# Patient Record
Sex: Male | Born: 1945 | Race: White | Hispanic: No | Marital: Married | Smoking: Former smoker
Health system: Southern US, Community
[De-identification: ages and names within clinical notes are randomized; demographics above are authoritative.]

## PROBLEM LIST (undated history)

## (undated) DIAGNOSIS — C61 Malignant neoplasm of prostate: Secondary | ICD-10-CM

## (undated) DIAGNOSIS — I4891 Unspecified atrial fibrillation: Secondary | ICD-10-CM

## (undated) DIAGNOSIS — I10 Essential (primary) hypertension: Secondary | ICD-10-CM

## (undated) DIAGNOSIS — M109 Gout, unspecified: Secondary | ICD-10-CM

## (undated) HISTORY — PX: PROSTATE SURGERY: SHX751

## (undated) HISTORY — PX: HERNIA REPAIR: SHX51

## (undated) HISTORY — PX: ROTATOR CUFF REPAIR: SHX139

## (undated) HISTORY — DX: Malignant neoplasm of prostate: C61

## (undated) HISTORY — DX: Unspecified atrial fibrillation: I48.91

## (undated) HISTORY — DX: Gout, unspecified: M10.9

## (undated) HISTORY — DX: Essential (primary) hypertension: I10

---

## 2021-03-23 ENCOUNTER — Other Ambulatory Visit: Payer: Self-pay

## 2021-03-23 ENCOUNTER — Ambulatory Visit (INDEPENDENT_AMBULATORY_CARE_PROVIDER_SITE_OTHER): Payer: Self-pay | Admitting: Urology

## 2021-03-23 ENCOUNTER — Encounter: Payer: Self-pay | Admitting: Urology

## 2021-03-23 VITALS — BP 165/62 | HR 60

## 2021-03-23 DIAGNOSIS — N529 Male erectile dysfunction, unspecified: Secondary | ICD-10-CM

## 2021-03-23 DIAGNOSIS — R9721 Rising PSA following treatment for malignant neoplasm of prostate: Secondary | ICD-10-CM

## 2021-03-23 DIAGNOSIS — C61 Malignant neoplasm of prostate: Secondary | ICD-10-CM

## 2021-03-23 LAB — URINALYSIS, ROUTINE W REFLEX MICROSCOPIC
Bilirubin, UA: NEGATIVE
Glucose, UA: NEGATIVE
Ketones, UA: NEGATIVE
Leukocytes,UA: NEGATIVE
Nitrite, UA: NEGATIVE
Protein,UA: NEGATIVE
RBC, UA: NEGATIVE
Specific Gravity, UA: 1.02 (ref 1.005–1.030)
Urobilinogen, Ur: 0.2 mg/dL (ref 0.2–1.0)
pH, UA: 5.5 (ref 5.0–7.5)

## 2021-03-23 MED ORDER — SILDENAFIL CITRATE 100 MG PO TABS: 100.0000 mg | ORAL_TABLET | Freq: Every day | ORAL | 11 refills | Status: DC | PRN

## 2021-03-23 NOTE — Progress Notes (Signed)

## 2021-03-23 NOTE — Progress Notes (Signed)
Assessment: 1. Prostate cancer ([place]); pT2cNxMx, Gleason 4+3; s/p RALP 10/06   2. Rising PSA following treatment for malignant neoplasm of prostate   3. Organic impotence     Plan: I reviewed his prior urology notes from [place]. PSA today I will contact him with results.  I discussed further evaluation with possible PSMA PET scan. Refill of sildenafil 100 mg as needed provided. Return to office in 6 months with PSA.  Chief Complaint: Chief Complaint  Patient presents with   Prostate Cancer    HPI: Francisco Stevens is a 75 y.o. male who presents for continued evaluation of history of prostate cancer and rising PSA. He is s/p RALP in October 2006. Path showed pT2cNxMx, Gleason 4+3=7 with negative margins. Pretreatment PSA was 5.58.  He has had a slowly rising PSA in recent years. Post op PSA: 12/12 0.01  1/15 0.01  3/17 0.02  5/18 0.05  7/19 0.12  8/19 0.09  11/19 0.11  7/20 0.13  1/21 0.13  7/21 0.15  6/22 0.20   His urinary symptoms are unchanged.  He has nocturia x2 and occasional incontinence.  No dysuria or gross hematuria.  He voids with a good stream. He does have erectile dysfunction.  He is currently using sildenafil 100 mg as needed with satisfactory results.  He was recently found to have a small palpable nodule in the left inguinal area.  Portions of the above documentation were copied from a prior visit for review purposes only.  Allergies: Allergies  Allergen Reactions   Amlodipine    Tape     PMH: Past Medical History:  Diagnosis Date   Atrial fibrillation (HCC)    Gout    Hypertension    Prostate cancer ([place])     PSH: Past Surgical History:  Procedure Laterality Date   HERNIA REPAIR     PROSTATE SURGERY     ROTATOR CUFF REPAIR      SH: Social History   Tobacco Use   Smoking status: Former    Types: Cigarettes   Smokeless tobacco: Never  Substance Use Topics   Alcohol use: Yes   Drug use: Never     ROS: Constitutional:  Negative for fever, chills, weight loss CV: Negative for chest pain, previous MI, hypertension Respiratory:  Negative for shortness of breath, wheezing, sleep apnea, frequent cough GI:  Negative for nausea, vomiting, bloody stool, GERD  PE: BP (!) 165/62   Pulse 60  GENERAL APPEARANCE:  Well appearing, well developed, well nourished, NAD HEENT:  Atraumatic, normocephalic, oropharynx clear NECK:  Supple without lymphadenopathy or thyromegaly ABDOMEN:  Soft, non-tender, no masses; small nodule left inguinal area ? Lymph node EXTREMITIES:  Moves all extremities well, without clubbing, cyanosis, or edema NEUROLOGIC:  Alert and oriented x 3, normal gait, CN II-XII grossly intact MENTAL STATUS:  appropriate BACK:  Non-tender to palpation, No CVAT SKIN:  Warm, dry, and intact   Results: Results for orders placed or performed in visit on 03/23/21 (from the past 24 hour(s))  Urinalysis, Routine w reflex microscopic     Status: None   Collection Time: 03/23/21 10:36 AM  Result Value Ref Range   Specific Gravity, UA 1.020 1.005 - 1.030   pH, UA 5.5 5.0 - 7.5   Color, UA Yellow Yellow   Appearance Ur Clear Clear   Leukocytes,UA Negative Negative   Protein,UA Negative Negative/Trace   Glucose, UA Negative Negative   Ketones, UA Negative Negative   RBC, UA Negative Negative  Bilirubin, UA Negative Negative   Urobilinogen, Ur 0.2 0.2 - 1.0 mg/dL   Nitrite, UA Negative Negative   Microscopic Examination Comment    Narrative   Performed at:  [facility] [address], [place], [place]  [id number] Lab Director: Deaver, Phone:  [phone]

## 2021-03-24 LAB — PSA: Prostate Specific Ag, Serum: 0.3 ng/mL (ref 0.0–4.0)

## 2021-03-26 NOTE — Addendum Note (Signed)
Addended by: Primus Bravo on: 03/26/2021 01:49 PM   Modules accepted: Orders

## 2021-04-09 ENCOUNTER — Other Ambulatory Visit: Payer: Self-pay

## 2021-04-09 ENCOUNTER — Encounter (HOSPITAL_COMMUNITY)
Admission: RE | Admit: 2021-04-09 | Discharge: 2021-04-09 | Disposition: A | Discharge status: Home / Self Care | Payer: PRIVATE HEALTH INSURANCE | Source: Ambulatory Visit | Attending: Urology | Admitting: Urology

## 2021-04-09 DIAGNOSIS — R9721 Rising PSA following treatment for malignant neoplasm of prostate: Secondary | ICD-10-CM

## 2021-04-09 MED ORDER — PIFLIFOLASTAT F 18 (PYLARIFY) INJECTION
9.0000 | Freq: Once | INTRAVENOUS | Status: AC
  Administered 2021-04-09: 8.8 via INTRAVENOUS

## 2021-09-21 ENCOUNTER — Ambulatory Visit: Payer: Self-pay | Admitting: Urology

## 2022-08-23 ENCOUNTER — Other Ambulatory Visit: Payer: Self-pay

## 2022-08-23 ENCOUNTER — Ambulatory Visit: Payer: PRIVATE HEALTH INSURANCE | Attending: Orthopedic Surgery

## 2022-08-23 DIAGNOSIS — R262 Difficulty in walking, not elsewhere classified: Secondary | ICD-10-CM

## 2022-08-23 DIAGNOSIS — Z96651 Presence of right artificial knee joint: Secondary | ICD-10-CM | POA: Diagnosis present

## 2022-08-23 NOTE — Therapy (Signed)
OUTPATIENT PHYSICAL THERAPY LOWER EXTREMITY EVALUATION   Patient Name: Francisco Stevens MRN: [id number] DOB:February 07, 1946, 77 y.o., male Today's Date: 08/23/2022  END OF SESSION:  PT End of Session - 08/23/22 1449     Visit Number 1    Date for PT Re-Evaluation 10/04/22    Progress Note Due on Visit 10    PT Start Time 1445    PT Stop Time 1530    PT Time Calculation (min) 45 min    Activity Tolerance Patient tolerated treatment well    Behavior During Therapy WFL for tasks assessed/performed             Past Medical History:  Diagnosis Date   Atrial fibrillation (HCC)    Gout    Hypertension    Prostate cancer [facility])    Past Surgical History:  Procedure Laterality Date   HERNIA REPAIR     PROSTATE SURGERY     ROTATOR CUFF REPAIR     Patient Active Problem List   Diagnosis Date Noted   Prostate cancer [facility]); pT2cNxMx, Gleason 4+3; s/p RALP 10/06 03/23/2021   Rising PSA following treatment for malignant neoplasm of prostate 03/23/2021   Organic impotence 03/23/2021    PCP: Elease Hashimoto, PA  REFERRING PROVIDER: Durene Romans, MD  REFERRING DIAG: s/p R TKA 08/18/22  THERAPY DIAG:  History of total right knee replacement  Difficulty in walking, not elsewhere classified  Rationale for Evaluation and Treatment: Rehabilitation  ONSET DATE: 08/18/22  SUBJECTIVE:   SUBJECTIVE STATEMENT: I want to get better and on the golf course as soon as possible.    PERTINENT HISTORY: Progressive decline ., DJD R knee, underwent TKA . Referred to our clinic for post op rehab PAIN:  Are you having pain? Yes: NPRS scale: 3/10 Pain location: r knee  Pain description: low pain, have been cutting dosage of pain meds  Aggravating factors: the swelling is irritant Relieving factors: ice  PRECAUTIONS: None  WEIGHT BEARING RESTRICTIONS: No  FALLS:  Has patient fallen in last 6 months? No  LIVING ENVIRONMENT: Lives with: lives with their spouse Lives in:  House/apartment Stairs: indoors Has following equipment at home: Single point cane and Environmental consultant - 2 wheeled  OCCUPATION: owns a Physiological scientist   PLOF: Independent  PATIENT GOALS: return to playing golf ASAP  NEXT MD VISIT: 2 weeks   OBJECTIVE:   DIAGNOSTIC FINDINGS: na  PATIENT SURVEYS:  LEFS 20/80   COGNITION: Overall cognitive status: Within functional limits for tasks assessed     SENSATION: WFL  EDEMA:  Mod edema noted R knee, lower leg, ankle.  Bruising R medial and post thigh.  Has kinesiotape along knee and thigh , around aquacel bandage, patient reports wife's friend applied to reduce edema   POSTURE: No Significant postural limitations  PALPATION: Good patellar mobility noted R  LOWER EXTREMITY ROM:  Active ROM Right eval Left eval  Hip flexion    Hip extension    Hip abduction    Hip adduction    Hip internal rotation    Hip external rotation    Knee flexion 125   Knee extension -10   Ankle dorsiflexion    Ankle plantarflexion    Ankle inversion    Ankle eversion     (Blank rows = not tested)  LOWER EXTREMITY MMT: R long arc quad , SLR -18  MMT Right eval Left eval  Hip flexion    Hip extension    Hip abduction    Hip  adduction    Hip internal rotation    Hip external rotation    Knee flexion 4   Knee extension 4   Ankle dorsiflexion    Ankle plantarflexion 4   Ankle inversion    Ankle eversion     All other WNL    FUNCTIONAL TESTS:  10 meter walk test: 10.94 sec , 0.91 m/sec  GAIT: Distance walked: 120 ' in clinic Assistive device utilized: None Level of assistance: Complete Independence Comments: mild habitually flexed R knee    TODAY'S TREATMENT:                                                                                                                              DATE: 08/23/22: Eval, instructed in the following to add to his home program": Quads sets with towel roll under knee Seated R post capsule stretch  R knee ( wife video'd position) Seated R knee flexion stretch  Standing B heel drops for calf stretch B  Game ready 10 min post session, for edema management, post ex pain     PATIENT EDUCATION:  Education details: POC goals Person educated: Patient Education method: Programmer, multimedia, Demonstration, Tactile cues, Verbal cues, and Handouts Education comprehension: verbalized understanding, returned demonstration, and verbal cues required  HOME EXERCISE PROGRAM: Printed from med bridge   ASSESSMENT:  CLINICAL IMPRESSION: Patient is a 77 y.o. male  who was seen today for physical therapy evaluation and treatment for s/p R TKA 08/18/22: He has made an excellent recovery so far, good movement and quads strength at this point.  He is not using a device for gait, biggest concern is R knee extensor lag, focused on this limitation more today with instruction in home program.  Has edema R LE which is expected at this point .  Should benefit from skilled PT to address his goals and return to his expected level of function.  .   OBJECTIVE IMPAIRMENTS: decreased mobility, difficulty walking, decreased ROM, decreased strength, increased edema, impaired flexibility, and pain.   ACTIVITY LIMITATIONS: squatting, sleeping, stairs, and locomotion level  PARTICIPATION LIMITATIONS: driving and community activity  PERSONAL FACTORS: Fitness, Time since onset of injury/illness/exacerbation, and 1 comorbidity: afib  are also affecting patient's functional outcome.   REHAB POTENTIAL: Good  CLINICAL DECISION MAKING: Stable/uncomplicated  EVALUATION COMPLEXITY: Low   GOALS: Goals reviewed with patient? Yes  SHORT TERM GOALS: Target date: 2 weeks, 09/06/22 I HEP Baseline: initiated today Goal status: INITIAL   LONG TERM GOALS: Target date: 10/04/22  ROM R knee -2 to 128 Baseline: -10 to 125 Goal status: INITIAL  2.  Strength R LE 5/5 for efficient gait, transfers, stairs Baseline: 4/5 R quads, hams,  SLR Goal status: INITIAL  3.  LEFS improve to 10/80 Baseline: 20/80 Goal status: INITIAL  4.  I unilateral stance on R LE greater than 30 sec for balance Baseline: not assessed initially Goal status: INITIAL     PLAN:  PT FREQUENCY: 2x/week  PT DURATION: 8 weeks  PLANNED INTERVENTIONS: Therapeutic exercises, Therapeutic activity, Neuromuscular re-education, Balance training, Gait training, Patient/Family education, Self Care, and Joint mobilization  PLAN FOR NEXT SESSION: reassess flexibility, initiate Nustep, continue to advance strengthening as is appropriate   Chelcey Caputo L Pariss Hommes, PT 08/23/2022, 6:09 PM

## 2022-08-25 ENCOUNTER — Ambulatory Visit: Payer: PRIVATE HEALTH INSURANCE | Attending: Orthopedic Surgery

## 2022-08-25 DIAGNOSIS — Z96651 Presence of right artificial knee joint: Secondary | ICD-10-CM | POA: Diagnosis present

## 2022-08-25 DIAGNOSIS — R262 Difficulty in walking, not elsewhere classified: Secondary | ICD-10-CM | POA: Diagnosis present

## 2022-08-25 NOTE — Therapy (Signed)
OUTPATIENT PHYSICAL THERAPY TREATMENT   Patient Name: Francisco Stevens MRN: [id number] DOB:1945/08/25, 77 y.o., male Today's Date: 08/25/2022  END OF SESSION:  PT End of Session - 08/25/22 1705     Visit Number 2    Date for PT Re-Evaluation 10/04/22    Progress Note Due on Visit 10    PT Start Time 1659    PT Stop Time 1740    PT Time Calculation (min) 41 min    Activity Tolerance Patient tolerated treatment well    Behavior During Therapy WFL for tasks assessed/performed              Past Medical History:  Diagnosis Date   Atrial fibrillation (HCC)    Gout    Hypertension    Prostate cancer [facility])    Past Surgical History:  Procedure Laterality Date   HERNIA REPAIR     PROSTATE SURGERY     ROTATOR CUFF REPAIR     Patient Active Problem List   Diagnosis Date Noted   Prostate cancer [facility]); pT2cNxMx, Gleason 4+3; s/p RALP 10/06 03/23/2021   Rising PSA following treatment for malignant neoplasm of prostate 03/23/2021   Organic impotence 03/23/2021    PCP: Elease Hashimoto, PA  REFERRING PROVIDER: Durene Romans, MD  REFERRING DIAG: s/p R TKA 08/18/22  THERAPY DIAG:  History of total right knee replacement  Difficulty in walking, not elsewhere classified  Rationale for Evaluation and Treatment: Rehabilitation  ONSET DATE: 08/18/22  SUBJECTIVE:   SUBJECTIVE STATEMENT: Pt is having mild pain.  PERTINENT HISTORY: Progressive decline ., DJD R knee, underwent TKA . Referred to our clinic for post op rehab PAIN:  Are you having pain? Yes: NPRS scale: 3/10 Pain location: R knee  Pain description: low pain, have been cutting dosage of pain meds  Aggravating factors: the swelling is irritant Relieving factors: ice  PRECAUTIONS: None  WEIGHT BEARING RESTRICTIONS: No  FALLS:  Has patient fallen in last 6 months? No  LIVING ENVIRONMENT: Lives with: lives with their spouse Lives in: House/apartment Stairs: indoors Has following equipment at home: Single  point cane and Environmental consultant - 2 wheeled  OCCUPATION: owns a Physiological scientist   PLOF: Independent  PATIENT GOALS: return to playing golf ASAP  NEXT MD VISIT: 2 weeks   OBJECTIVE:   DIAGNOSTIC FINDINGS: na  PATIENT SURVEYS:  LEFS 20/80   COGNITION: Overall cognitive status: Within functional limits for tasks assessed     SENSATION: WFL  EDEMA:  Mod edema noted R knee, lower leg, ankle.  Bruising R medial and post thigh.  Has kinesiotape along knee and thigh , around aquacel bandage, patient reports wife's friend applied to reduce edema   POSTURE: No Significant postural limitations  PALPATION: Good patellar mobility noted R  LOWER EXTREMITY ROM:  Active ROM Right eval Left eval  Hip flexion    Hip extension    Hip abduction    Hip adduction    Hip internal rotation    Hip external rotation    Knee flexion 125   Knee extension -10   Ankle dorsiflexion    Ankle plantarflexion    Ankle inversion    Ankle eversion     (Blank rows = not tested)  LOWER EXTREMITY MMT: R long arc quad , SLR -18  MMT Right eval Left eval  Hip flexion    Hip extension    Hip abduction    Hip adduction    Hip internal rotation    Hip external  rotation    Knee flexion 4   Knee extension 4   Ankle dorsiflexion    Ankle plantarflexion 4   Ankle inversion    Ankle eversion     All other WNL    FUNCTIONAL TESTS:  10 meter walk test: 10.94 sec , 0.91 m/sec  GAIT: Distance walked: 120 ' in clinic Assistive device utilized: None Level of assistance: Complete Independence Comments: mild habitually flexed R knee    TODAY'S TREATMENT:                                                                                                                              DATE:  08/25/22 Nustep L5x16min LE only Seated hamstring stretch 2x30 sec with strap Gastroc heel drop stretch 3x30" Standing heel raise 2x15 B Seated R LAQ 2x10  Supine QS x 10 towel under heel  Supine SLR 2x10  RLE Supine heel slides 2x10 LE on peanut ball Supine leg press with manual resistance   08/23/22: Eval, instructed in the following to add to his home program": Quads sets with towel roll under knee Seated R post capsule stretch R knee ( wife video'd position) Seated R knee flexion stretch  Standing B heel drops for calf stretch B  Game ready 10 min post session, for edema management, post ex pain     PATIENT EDUCATION:  Education details: HEP update  Person educated: Patient Education method: Explanation, Demonstration, Tactile cues, Verbal cues, and Handouts Education comprehension: verbalized understanding, returned demonstration, and verbal cues required  HOME EXERCISE PROGRAM: Access Code: [email or web address] URL: [email or web address] Date: 08/25/2022 Prepared by: Verta Ellen  Exercises - Seated Hamstring Stretch  - 2 x daily - 7 x weekly - 3 sets - 30 sec hold - Heel Raises with Counter Support  - 1 x daily - 7 x weekly - 3 sets - 10 reps - Standing Gastroc Stretch on Step  - 2 x daily - 7 x weekly - 3 sets - 30 sec hold - Seated Long Arc Quad  - 1 x daily - 7 x weekly - 3 sets - 10 reps  ASSESSMENT:  CLINICAL IMPRESSION: Continued with initial progression of exercises. Pt showed a good demonstration of the exercises. He still needs to work more on extension, so we added hamstring and gastroc stretch on step. He shows good quad contraction with QS and SLR with no quad lag. He shows really good AROM of R knee 1 week post op from TKA. Will plan to continue progressing strengthening and ROM to improve extension.   OBJECTIVE IMPAIRMENTS: decreased mobility, difficulty walking, decreased ROM, decreased strength, increased edema, impaired flexibility, and pain.   ACTIVITY LIMITATIONS: squatting, sleeping, stairs, and locomotion level  PARTICIPATION LIMITATIONS: driving and community activity  PERSONAL FACTORS: Fitness, Time since onset of injury/illness/exacerbation,  and 1 comorbidity: afib  are also affecting patient's functional outcome.   REHAB POTENTIAL: Good  CLINICAL DECISION MAKING: Stable/uncomplicated  EVALUATION COMPLEXITY: Low  GOALS: Goals reviewed with patient? Yes  SHORT TERM GOALS: Target date: 2 weeks, 09/06/22 I HEP Baseline: initiated today Goal status: IN PROGRESS   LONG TERM GOALS: Target date: 10/04/22  ROM R knee -2 to 128 Baseline: -10 to 125 Goal status: IN PROGRESS  2.  Strength R LE 5/5 for efficient gait, transfers, stairs Baseline: 4/5 R quads, hams, SLR Goal status: IN PROGRESS  3.  LEFS improve to 10/80 Baseline: 20/80 Goal status: IN PROGRESS  4.  I unilateral stance on R LE greater than 30 sec for balance Baseline: not assessed initially Goal status: IN PROGRESS     PLAN:  PT FREQUENCY: 2x/week  PT DURATION: 8 weeks  PLANNED INTERVENTIONS: Therapeutic exercises, Therapeutic activity, Neuromuscular re-education, Balance training, Gait training, Patient/Family education, Self Care, and Joint mobilization  PLAN FOR NEXT SESSION: reassess flexibility, continue to advance strengthening as is appropriate   Darleene Cleaver, PTA 08/25/2022, 5:59 PM

## 2022-08-30 ENCOUNTER — Ambulatory Visit: Payer: PRIVATE HEALTH INSURANCE

## 2022-08-31 ENCOUNTER — Ambulatory Visit: Payer: PRIVATE HEALTH INSURANCE

## 2022-08-31 DIAGNOSIS — Z96651 Presence of right artificial knee joint: Secondary | ICD-10-CM | POA: Diagnosis not present

## 2022-08-31 DIAGNOSIS — R262 Difficulty in walking, not elsewhere classified: Secondary | ICD-10-CM

## 2022-08-31 NOTE — Therapy (Signed)
OUTPATIENT PHYSICAL THERAPY TREATMENT   Patient Name: Francisco Stevens MRN: [id number] DOB:[birth year], 77 y.o., male Today's Date: 08/31/2022  END OF SESSION:  PT End of Session - 08/31/22 0954     Visit Number 3    Date for PT Re-Evaluation 10/04/22    Progress Note Due on Visit 10    PT Start Time 0933    PT Stop Time 1026    PT Time Calculation (min) 53 min    Activity Tolerance Patient tolerated treatment well    Behavior During Therapy WFL for tasks assessed/performed               Past Medical History:  Diagnosis Date   Atrial fibrillation (HCC)    Gout    Hypertension    Prostate cancer [facility])    Past Surgical History:  Procedure Laterality Date   HERNIA REPAIR     PROSTATE SURGERY     ROTATOR CUFF REPAIR     Patient Active Problem List   Diagnosis Date Noted   Prostate cancer [facility]); pT2cNxMx, Gleason 4+3; s/p RALP 10/06 03/23/2021   Rising PSA following treatment for malignant neoplasm of prostate 03/23/2021   Organic impotence 03/23/2021    PCP: Elease Hashimoto, PA  REFERRING PROVIDER: Durene Romans, MD  REFERRING DIAG: s/p R TKA 08/18/22  THERAPY DIAG:  History of total right knee replacement  Difficulty in walking, not elsewhere classified  Rationale for Evaluation and Treatment: Rehabilitation  ONSET DATE: 08/18/22  SUBJECTIVE:   SUBJECTIVE STATEMENT: Soreness from exercises.   PERTINENT HISTORY: Progressive decline ., DJD R knee, underwent TKA . Referred to our clinic for post op rehab PAIN:  Are you having pain? Yes: NPRS scale: 3 /10 Pain location: R knee  Pain description: low pain, have been cutting dosage of pain meds  Aggravating factors: the swelling is irritant Relieving factors: ice  PRECAUTIONS: None  WEIGHT BEARING RESTRICTIONS: No  FALLS:  Has patient fallen in last 6 months? No  LIVING ENVIRONMENT: Lives with: lives with their spouse Lives in: House/apartment Stairs: indoors Has following equipment at home:  Single point cane and Environmental consultant - 2 wheeled  OCCUPATION: owns a Physiological scientist   PLOF: Independent  PATIENT GOALS: return to playing golf ASAP  NEXT MD VISIT: 2 weeks   OBJECTIVE:   DIAGNOSTIC FINDINGS: na  PATIENT SURVEYS:  LEFS 20/80   COGNITION: Overall cognitive status: Within functional limits for tasks assessed     SENSATION: WFL  EDEMA:  Mod edema noted R knee, lower leg, ankle.  Bruising R medial and post thigh.  Has kinesiotape along knee and thigh , around aquacel bandage, patient reports wife's friend applied to reduce edema   POSTURE: No Significant postural limitations  PALPATION: Good patellar mobility noted R  LOWER EXTREMITY ROM:  Active ROM Right eval Left eval Right 08/31/22  Hip flexion     Hip extension     Hip abduction     Hip adduction     Hip internal rotation     Hip external rotation     Knee flexion 125  116  Knee extension -10  6  Ankle dorsiflexion     Ankle plantarflexion     Ankle inversion     Ankle eversion      (Blank rows = not tested)  LOWER EXTREMITY MMT: R long arc quad , SLR -18  MMT Right eval Left eval  Hip flexion    Hip extension    Hip abduction  Hip adduction    Hip internal rotation    Hip external rotation    Knee flexion 4   Knee extension 4   Ankle dorsiflexion    Ankle plantarflexion 4   Ankle inversion    Ankle eversion     All other WNL    FUNCTIONAL TESTS:  10 meter walk test: 10.94 sec , 0.91 m/sec  GAIT: Distance walked: 120 ' in clinic Assistive device utilized: None Level of assistance: Complete Independence Comments: mild habitually flexed R knee    TODAY'S TREATMENT:                                                                                                                              DATE:  08/31/22 Nustep L5x53min Standing knee flexion stretch 10x5" [employer] pe[employer] x 10 B Retro step x 20  Seated heel slides R with strap 10x5" Seated hamstring curl and knee  extension GTB 10x QS in supine x 10 R  08/25/22 Nustep L5x28min LE only Seated hamstring stretch 2x30 sec with strap Gastroc heel drop stretch 3x30" Standing heel raise 2x15 B Seated R LAQ 2x10  Supine QS x 10 towel under heel  Supine SLR 2x10 RLE Supine heel slides 2x10 LE on peanut ball Supine leg press with manual resistance   08/23/22: Eval, instructed in the following to add to his home program": Quads sets with towel roll under knee Seated R post capsule stretch R knee ( wife video'd position) Seated R knee flexion stretch  Standing B heel drops for calf stretch B  Game ready 10 min post session, for edema management, post ex pain     PATIENT EDUCATION:  Education details: HEP update  Person educated: Patient Education method: Explanation, Demonstration, Tactile cues, Verbal cues, and Handouts Education comprehension: verbalized understanding, returned demonstration, and verbal cues required  HOME EXERCISE PROGRAM: Access Code: [email or web address] URL: [email or web address] Date: 08/25/2022 Prepared by: Verta Ellen  Exercises - Seated Hamstring Stretch  - 2 x daily - 7 x weekly - 3 sets - 30 sec hold - Heel Raises with Counter Support  - 1 x daily - 7 x weekly - 3 sets - 10 reps - Standing Gastroc Stretch on Step  - 2 x daily - 7 x weekly - 3 sets - 30 sec hold - Seated Long Arc Quad  - 1 x daily - 7 x weekly - 3 sets - 10 reps  ASSESSMENT:  CLINICAL IMPRESSION: Continued with progression of exercises to tolerance. Focused on quad exercises to improve stability. Pt shows improved AROM today (6-116 deg). Concluded session with vaso to address pain and swelling.   OBJECTIVE IMPAIRMENTS: decreased mobility, difficulty walking, decreased ROM, decreased strength, increased edema, impaired flexibility, and pain.   ACTIVITY LIMITATIONS: squatting, sleeping, stairs, and locomotion level  PARTICIPATION LIMITATIONS: driving and community activity  PERSONAL FACTORS:  Fitness, Time since onset of injury/illness/exacerbation, and 1 comorbidity: afib  are also affecting patient's functional outcome.  REHAB POTENTIAL: Good  CLINICAL DECISION MAKING: Stable/uncomplicated  EVALUATION COMPLEXITY: Low   GOALS: Goals reviewed with patient? Yes  SHORT TERM GOALS: Target date: 2 weeks, 09/06/22 I HEP Baseline: initiated today Goal status: IN PROGRESS   LONG TERM GOALS: Target date: 10/04/22  ROM R knee -2 to 128 Baseline: -10 to 125 Goal status: IN PROGRESS  2.  Strength R LE 5/5 for efficient gait, transfers, stairs Baseline: 4/5 R quads, hams, SLR Goal status: IN PROGRESS  3.  LEFS improve to 10/80 Baseline: 20/80 Goal status: IN PROGRESS  4.  I unilateral stance on R LE greater than 30 sec for balance Baseline: not assessed initially Goal status: IN PROGRESS     PLAN:  PT FREQUENCY: 2x/week  PT DURATION: 8 weeks  PLANNED INTERVENTIONS: Therapeutic exercises, Therapeutic activity, Neuromuscular re-education, Balance training, Gait training, Patient/Family education, Self Care, and Joint mobilization  PLAN FOR NEXT SESSION: reassess flexibility, continue to advance strengthening as is appropriate   Darleene Cleaver, PTA 08/31/2022, 10:18 AM

## 2022-09-06 ENCOUNTER — Ambulatory Visit: Payer: PRIVATE HEALTH INSURANCE

## 2022-09-06 DIAGNOSIS — Z96651 Presence of right artificial knee joint: Secondary | ICD-10-CM

## 2022-09-06 DIAGNOSIS — R262 Difficulty in walking, not elsewhere classified: Secondary | ICD-10-CM

## 2022-09-06 NOTE — Therapy (Signed)
OUTPATIENT PHYSICAL THERAPY TREATMENT   Patient Name: Francisco Stevens MRN: [id number] DOB:11/29/1945, 77 y.o., male Today's Date: 09/06/2022  END OF SESSION:  PT End of Session - 09/06/22 1536     Visit Number 4    Date for PT Re-Evaluation 10/04/22    Progress Note Due on Visit 10    PT Start Time 1446    PT Stop Time 1532    PT Time Calculation (min) 46 min    Activity Tolerance Patient tolerated treatment well    Behavior During Therapy WFL for tasks assessed/performed               Past Medical History:  Diagnosis Date   Atrial fibrillation (HCC)    Gout    Hypertension    Prostate cancer [facility])    Past Surgical History:  Procedure Laterality Date   HERNIA REPAIR     PROSTATE SURGERY     ROTATOR CUFF REPAIR     Patient Active Problem List   Diagnosis Date Noted   Prostate cancer [facility]); pT2cNxMx, Gleason 4+3; s/p RALP 10/06 03/23/2021   Rising PSA following treatment for malignant neoplasm of prostate 03/23/2021   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   PERTINENT HISTORY: Progressive decline ., DJD R knee, underwent TKA . Referred to our clinic for post op rehab PAIN:  Are you having pain? Yes: NPRS scale: 2/10 Pain location: R knee  Pain description: low pain, have been cutting dosage of pain meds  Aggravating factors: the swelling is irritant Relieving factors: ice  PRECAUTIONS: None  WEIGHT BEARING RESTRICTIONS: No  FALLS:  Has patient fallen in last 6 months? No  LIVING ENVIRONMENT: Lives with: lives with their spouse Lives in: House/apartment Stairs:  indoors Has following equipment at home: Single point cane and Environmental consultant - 2 wheeled  OCCUPATION: owns a Physiological scientist   PLOF: Independent  PATIENT GOALS: return to playing golf ASAP  NEXT MD VISIT: 2 weeks   OBJECTIVE:   DIAGNOSTIC FINDINGS: na  PATIENT SURVEYS:  LEFS 20/80   COGNITION: Overall cognitive status: Within functional limits for tasks assessed     SENSATION: WFL  EDEMA:  Mod edema noted R knee, lower leg, ankle.  Bruising R medial and post thigh.  Has kinesiotape along knee and thigh , around aquacel bandage, patient reports wife's friend applied to reduce edema   POSTURE: No Significant postural limitations  PALPATION: Good patellar mobility noted R  LOWER EXTREMITY ROM:  Active ROM Right eval Left eval Right 08/31/22 Right 09/06/22  Hip flexion      Hip extension      Hip abduction      Hip adduction      Hip internal rotation      Hip external rotation      Knee flexion 125  116 121  Knee extension -10  6 4   Ankle dorsiflexion      Ankle plantarflexion      Ankle inversion      Ankle eversion       (Blank rows = not tested)  LOWER EXTREMITY MMT: R long arc  quad , SLR -18  MMT Right eval Left eval  Hip flexion    Hip extension    Hip abduction    Hip adduction    Hip internal rotation    Hip external rotation    Knee flexion 4   Knee extension 4   Ankle dorsiflexion    Ankle plantarflexion 4   Ankle inversion    Ankle eversion     All other WNL    FUNCTIONAL TESTS:  10 meter walk test: 10.94 sec , 0.91 m/sec  GAIT: Distance walked: 120 ' in clinic Assistive device utilized: None Level of assistance: Complete Independence Comments: mild habitually flexed R knee    TODAY'S TREATMENT:                                                                                                                              DATE:  09/06/22 Bike L1x65min Step ups fwd 8' x 10 R Lateral step up x 10 R R gastroc stretch on step 8' 2x30  sec Leg press 25# 2x10 BLE; 10# RLE 2x10 Knee flexion 35# 2x10 BLE; 10# 2x10 RLE Knee extension 10# 2x10 B con/R ecc  Prone hang x 1 min Supine knee extension with over pressure x 10  08/31/22 Nustep L5x81min Standing knee flexion stretch 10x5" [employer] pews x 10 B Retro step x 20  Seated heel slides R with strap 10x5" Seated hamstring curl and knee extension GTB 10x QS in supine x 10 R  08/25/22 Nustep L5x56min LE only Seated hamstring stretch 2x30 sec with strap Gastroc heel drop stretch 3x30" Standing heel raise 2x15 B Seated R LAQ 2x10  Supine QS x 10 towel under heel  Supine SLR 2x10 RLE Supine heel slides 2x10 LE on peanut ball Supine leg press with manual resistance   08/23/22: Eval, instructed in the following to add to his home program": Quads sets with towel roll under knee Seated R post capsule stretch R knee ( wife video'd position) Seated R knee flexion stretch  Standing B heel drops for calf stretch B  Game ready 10 min post session, for edema management, post ex pain     PATIENT EDUCATION:  Education details: HEP update  Person educated: Patient Education method: Explanation, Demonstration, Tactile cues, Verbal cues, and Handouts Education comprehension: verbalized understanding, returned demonstration, and verbal cues required  HOME EXERCISE PROGRAM: Access Code: [email or web address] URL: [email or web address] Date: 09/06/2022 Prepared by: Verta Ellen  Exercises - Seated Hamstring Stretch  - 2 x daily - 7 x weekly - 3 sets - 30 sec hold - Heel Raises with Counter Support  - 1 x daily - 7 x weekly - 3 sets - 10 reps - Standing Gastroc Stretch on Step  - 2 x daily - 7 x weekly - 3 sets - 30 sec hold - Seated Long Arc Quad  - 1 x daily - 7 x weekly - 3 sets - 10 reps - [employer]  -  1 x daily - 7 x weekly - 3 sets - 10 reps - Retro Step  - 1 x daily - 7 x weekly - 3 sets - 10 reps   ASSESSMENT:  CLINICAL IMPRESSION: Continued with progression of  exercises to tolerance. Pt shows great ability to progress with step ups and weight machines. He has a lot of swelling over his patella and reports that fluid was drawn from knee on MD visit from Thursday. Knee ext has improve to 4 deg today. Good response to the progression of exercises and would continue to benefit from skilled therapy.   OBJECTIVE IMPAIRMENTS: decreased mobility, difficulty walking, decreased ROM, decreased strength, increased edema, impaired flexibility, and pain.   ACTIVITY LIMITATIONS: squatting, sleeping, stairs, and locomotion level  PARTICIPATION LIMITATIONS: driving and community activity  PERSONAL FACTORS: Fitness, Time since onset of injury/illness/exacerbation, and 1 comorbidity: afib  are also affecting patient's functional outcome.   REHAB POTENTIAL: Good  CLINICAL DECISION MAKING: Stable/uncomplicated  EVALUATION COMPLEXITY: Low   GOALS: Goals reviewed with patient? Yes  SHORT TERM GOALS: Target date: 2 weeks, 09/06/22 I HEP Baseline: initiated today Goal status: MET   LONG TERM GOALS: Target date: 10/04/22  ROM R knee -2 to 128 Baseline: -10 to 125 Goal status: IN PROGRESS  2.  Strength R LE 5/5 for efficient gait, transfers, stairs Baseline: 4/5 R quads, hams, SLR Goal status: IN PROGRESS  3.  LEFS improve to 10/80 Baseline: 20/80 Goal status: IN PROGRESS  4.  I unilateral stance on R LE greater than 30 sec for balance Baseline: not assessed initially Goal status: IN PROGRESS     PLAN:  PT FREQUENCY: 2x/week  PT DURATION: 8 weeks  PLANNED INTERVENTIONS: Therapeutic exercises, Therapeutic activity, Neuromuscular re-education, Balance training, Gait training, Patient/Family education, Self Care, and Joint mobilization  PLAN FOR NEXT SESSION: reassess flexibility, continue to advance strengthening as is appropriate   Darleene Cleaver, PTA 09/06/2022, 3:37 PM

## 2022-09-08 ENCOUNTER — Other Ambulatory Visit: Payer: Self-pay

## 2022-09-08 ENCOUNTER — Ambulatory Visit: Payer: PRIVATE HEALTH INSURANCE

## 2022-09-08 DIAGNOSIS — R262 Difficulty in walking, not elsewhere classified: Secondary | ICD-10-CM

## 2022-09-08 DIAGNOSIS — Z96651 Presence of right artificial knee joint: Secondary | ICD-10-CM | POA: Diagnosis not present

## 2022-09-08 NOTE — Therapy (Signed)
OUTPATIENT PHYSICAL THERAPY TREATMENT   Patient Name: Francisco Stevens MRN: [id number] DOB:1946/02/25, 77 y.o., male Today's Date: 09/08/2022  END OF SESSION:  PT End of Session - 09/08/22 1711     Visit Number 5    Date for PT Re-Evaluation 10/04/22    Progress Note Due on Visit 10    PT Start Time 1445    PT Stop Time 1530    PT Time Calculation (min) 45 min    Activity Tolerance Patient tolerated treatment well    Behavior During Therapy WFL for tasks assessed/performed               Past Medical History:  Diagnosis Date   Atrial fibrillation (HCC)    Gout    Hypertension    Prostate cancer [facility])    Past Surgical History:  Procedure Laterality Date   HERNIA REPAIR     PROSTATE SURGERY     ROTATOR CUFF REPAIR     Patient Active Problem List   Diagnosis Date Noted   Prostate cancer [facility]); pT2cNxMx, Gleason 4+3; s/p RALP 10/06 03/23/2021   Rising PSA following treatment for malignant neoplasm of prostate 03/23/2021   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 Referred to our clinic for post op rehab PAIN:  Are you having pain? Yes: NPRS scale: 2/10 Pain location: R knee  Pain description: low pain, have been cutting dosage of pain meds  Aggravating factors: the swelling is irritant Relieving factors: ice  PRECAUTIONS: None  WEIGHT BEARING RESTRICTIONS: No  FALLS:  Has patient fallen in last 6 months? No  LIVING ENVIRONMENT: Lives with: lives with their spouse Lives in:  House/apartment Stairs: indoors Has following equipment at home: Single point cane and Environmental consultant - 2 wheeled  OCCUPATION: owns a Physiological scientist   PLOF: Independent  PATIENT GOALS: return to playing golf ASAP  NEXT MD VISIT: 2 weeks   OBJECTIVE:   DIAGNOSTIC FINDINGS: na  PATIENT SURVEYS:  LEFS 20/80   COGNITION: Overall cognitive status: Within functional limits for tasks assessed     SENSATION: WFL  EDEMA:  Mod edema noted R knee, lower leg, ankle.  Bruising R medial and post thigh.  Has kinesiotape along knee and thigh , around aquacel bandage, patient reports wife's friend applied to reduce edema   POSTURE: No Significant postural limitations  PALPATION: Good patellar mobility noted R  LOWER EXTREMITY ROM:  Active ROM Right eval Left eval Right 08/31/22 Right 09/06/22  Hip flexion      Hip extension      Hip abduction      Hip adduction      Hip internal rotation      Hip external rotation      Knee flexion 125  116 121  Knee extension -10  6 4   Ankle dorsiflexion      Ankle plantarflexion      Ankle inversion      Ankle eversion       (Blank rows = not tested)  LOWER EXTREMITY MMT: R long arc quad ,  SLR -18  MMT Right eval Left eval  Hip flexion    Hip extension    Hip abduction    Hip adduction    Hip internal rotation    Hip external rotation    Knee flexion 4   Knee extension 4   Ankle dorsiflexion    Ankle plantarflexion 4   Ankle inversion    Ankle eversion     All other WNL    FUNCTIONAL TESTS:  10 meter walk test: 10.94 sec , 0.91 m/sec  GAIT: Distance walked: 120 ' in clinic Assistive device utilized: None Level of assistance: Complete Independence Comments: mild habitually flexed R knee    TODAY'S TREATMENT:                                                                                                                              DATE:  09/08/22: Bike L3 6 min Supine for NMES, 4 pads, distal quads combined with  short arc quads, russian stim, 8 min, 10sec hold, 20 sec rest, utilized to engage better distal quads motor engagement and to milk fluid from ant knee Standing post leans against wall, heel rocks to cue distal quads mass practice Standing forward heel taps L from 1 1/2 " book to engage distal R quads mass practice Leg press 25# 2x10 BLE; 10# RLE 2x10 Knee flexion 35# 2x10 BLE; 10# 2x10 RLE Knee extension 10# 2x10 B con/R ecc   09/06/22 Bike L1x9min Step ups fwd 8' x 10 R Lateral step up x 10 R R gastroc stretch on step 8' 2x30 sec Leg press 25# 2x10 BLE; 10# RLE 2x10 Knee flexion 35# 2x10 BLE; 10# 2x10 RLE Knee extension 10# 2x10 B con/R ecc  Prone hang x 1 min Supine knee extension with over pressure x 10  08/31/22 Nustep L5x58min Standing knee flexion stretch 10x5" [employer] pews x 10 B Retro step x 20  Seated heel slides R with strap 10x5" Seated hamstring curl and knee extension GTB 10x QS in supine x 10 R  08/25/22 Nustep L5x51min LE only Seated hamstring stretch 2x30 sec with strap Gastroc heel drop stretch 3x30" Standing heel raise 2x15 B Seated R LAQ 2x10  Supine QS x 10 towel under heel  Supine SLR 2x10 RLE Supine heel slides 2x10 LE on peanut ball Supine leg press with manual resistance   08/23/22: Eval, instructed in the following to add to his home program": Quads sets with towel roll under knee Seated R post capsule stretch R knee ( wife video'd position) Seated R knee flexion stretch  Standing B heel drops for calf stretch B  Game ready 10 min post session, for edema management, post ex pain     PATIENT EDUCATION:  Education details: HEP update  Person educated: Patient Education method: Explanation, Demonstration, Tactile cues, Verbal cues, and Handouts Education comprehension: verbalized understanding, returned demonstration, and verbal cues required  HOME EXERCISE PROGRAM: Access Code: [email or web address] URL: [email or web address]  Date:  09/06/2022 Prepared by: Verta Ellen  Exercises - Seated Hamstring Stretch  - 2 x daily - 7 x weekly - 3 sets - 30 sec hold - Heel Raises with Counter Support  - 1 x daily - 7 x weekly - 3 sets - 10 reps - Standing Gastroc Stretch on Step  - 2 x daily - 7 x weekly - 3 sets - 30 sec hold - Seated Long Arc Quad  - 1 x daily - 7 x weekly - 3 sets - 10 reps - [employer]  - 1 x daily - 7 x weekly - 3 sets - 10 reps - Retro Step  - 1 x daily - 7 x weekly - 3 sets - 10 reps   ASSESSMENT:  CLINICAL IMPRESSION: Continued with progression of exercises to tolerance, 3 weeks post op from R TKA.  Largest problem is edema R ant knee. Tried to address more specifically today.  Excellent balance and gait pattern. Wil continue to benefit from skilled physical therapy to maximize his recovery from R TKA.   OBJECTIVE IMPAIRMENTS: decreased mobility, difficulty walking, decreased ROM, decreased strength, increased edema, impaired flexibility, and pain.   ACTIVITY LIMITATIONS: squatting, sleeping, stairs, and locomotion level  PARTICIPATION LIMITATIONS: driving and community activity  PERSONAL FACTORS: Fitness, Time since onset of injury/illness/exacerbation, and 1 comorbidity: afib  are also affecting patient's functional outcome.   REHAB POTENTIAL: Good  CLINICAL DECISION MAKING: Stable/uncomplicated  EVALUATION COMPLEXITY: Low   GOALS: Goals reviewed with patient? Yes  SHORT TERM GOALS: Target date: 2 weeks, 09/06/22 I HEP Baseline: initiated today Goal status: MET   LONG TERM GOALS: Target date: 10/04/22  ROM R knee -2 to 128 Baseline: -10 to 125 Goal status: IN PROGRESS  2.  Strength R LE 5/5 for efficient gait, transfers, stairs Baseline: 4/5 R quads, hams, SLR Goal status: IN PROGRESS  3.  LEFS improve to 10/80 Baseline: 20/80 Goal status: IN PROGRESS  4.  I unilateral stance on R LE greater than 30 sec for balance Baseline: not assessed initially Goal status: IN  PROGRESS     PLAN:  PT FREQUENCY: 2x/week  PT DURATION: 8 weeks  PLANNED INTERVENTIONS: Therapeutic exercises, Therapeutic activity, Neuromuscular re-education, Balance training, Gait training, Patient/Family education, Self Care, and Joint mobilization  PLAN FOR NEXT SESSION: reassess flexibility, continue to advance strengthening as is appropriate   Verley Pariseau L Dimple Bastyr, PT 09/08/2022, 5:13 PM

## 2022-09-13 ENCOUNTER — Other Ambulatory Visit: Payer: Self-pay

## 2022-09-13 ENCOUNTER — Ambulatory Visit: Payer: PRIVATE HEALTH INSURANCE

## 2022-09-13 DIAGNOSIS — Z96651 Presence of right artificial knee joint: Secondary | ICD-10-CM | POA: Diagnosis not present

## 2022-09-13 DIAGNOSIS — R262 Difficulty in walking, not elsewhere classified: Secondary | ICD-10-CM

## 2022-09-13 NOTE — Therapy (Signed)
OUTPATIENT PHYSICAL THERAPY TREATMENT   Patient Name: Francisco Stevens MRN: [id number] DOB:February 15, 1946, 77 y.o., male Today's Date: 09/13/2022  END OF SESSION:  PT End of Session - 09/13/22 1719     Visit Number 6    Date for PT Re-Evaluation 10/04/22    Progress Note Due on Visit 10    PT Start Time 1445    PT Stop Time 1530    PT Time Calculation (min) 45 min    Activity Tolerance Patient tolerated treatment well    Behavior During Therapy WFL for tasks assessed/performed                Past Medical History:  Diagnosis Date   Atrial fibrillation (HCC)    Gout    Hypertension    Prostate cancer [facility])    Past Surgical History:  Procedure Laterality Date   HERNIA REPAIR     PROSTATE SURGERY     ROTATOR CUFF REPAIR     Patient Active Problem List   Diagnosis Date Noted   Prostate cancer [facility]); pT2cNxMx, Gleason 4+3; s/p RALP 10/06 03/23/2021   Rising PSA following treatment for malignant neoplasm of prostate 03/23/2021   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 Referred to our clinic for post op rehab PAIN:  Are you having pain? Yes: NPRS scale: 2/10 Pain location: R knee  Pain description: low pain, have been cutting dosage of pain meds  Aggravating factors: the swelling is irritant Relieving factors: ice  PRECAUTIONS: None  WEIGHT BEARING RESTRICTIONS: No  FALLS:  Has patient fallen in last 6 months? No  LIVING ENVIRONMENT: Lives with: lives  with their spouse Lives in: House/apartment Stairs: indoors Has following equipment at home: Single point cane and Environmental consultant - 2 wheeled  OCCUPATION: owns a Physiological scientist   PLOF: Independent  PATIENT GOALS: return to playing golf ASAP  NEXT MD VISIT: 2 weeks   OBJECTIVE:   DIAGNOSTIC FINDINGS: na  PATIENT SURVEYS:  LEFS 20/80   COGNITION: Overall cognitive status: Within functional limits for tasks assessed     SENSATION: WFL  EDEMA:  Mod edema noted R knee, lower leg, ankle.  Bruising R medial and post thigh.  Has kinesiotape along knee and thigh , around aquacel bandage, patient reports wife's friend applied to reduce edema   POSTURE: No Significant postural limitations  PALPATION: Good patellar mobility noted R  LOWER EXTREMITY ROM:  Active ROM Right eval Left eval Right 08/31/22 Right 09/06/22  Hip flexion      Hip extension      Hip abduction      Hip adduction      Hip internal rotation      Hip external rotation      Knee flexion 125  116 121  Knee extension -10  6 4   Ankle dorsiflexion      Ankle plantarflexion      Ankle inversion      Ankle eversion       (Blank rows =  not tested)  LOWER EXTREMITY MMT: R long arc quad , SLR -18  MMT Right eval Left eval  Hip flexion    Hip extension    Hip abduction    Hip adduction    Hip internal rotation    Hip external rotation    Knee flexion 4   Knee extension 4   Ankle dorsiflexion    Ankle plantarflexion 4   Ankle inversion    Ankle eversion     All other WNL    FUNCTIONAL TESTS:  10 meter walk test: 10.94 sec , 0.91 m/sec  GAIT: Distance walked: 120 ' in clinic Assistive device utilized: None Level of assistance: Complete Independence Comments: mild habitually flexed R knee    TODAY'S TREATMENT:                                                                                                                              DATE:  09/13/22: Nustep L 5, LE's only 6 min  NMES  russian, 4 pads distal quads, co contract, utilized with short arc quads to engage distal quads and thereby milk, push fluid proximally. X 10 min, 10 sec on, 20 sec off R LE legg press, 15# 30 reps Lateral step downs L heel taps 4" cues to bend and fully straighten R knee Ball behind R knee for R knee extension isolation,  09/08/22: Bike L3 6 min NMES russian, 4 pads distal quads, co contract, utilized with short arc quads to engage distal quads and thereby milk, push fluid proximally. X 10 min, 10 sec on, 20 sec off Standing post leans against wall, heel rocks to cue distal quads mass practice Standing forward heel taps L from 1 1/2 " book to engage distal R quads mass practice Leg press 25# 2x10 BLE; 10# RLE 2x10 Knee flexion 35# 2x10 BLE; 10# 2x10 RLE Knee extension 10# 2x10 B con/R ecc   09/06/22 Bike L1x33min Step ups fwd 8' x 10 R Lateral step up x 10 R R gastroc stretch on step 8' 2x30 sec Leg press 25# 2x10 BLE; 10# RLE 2x10 Knee flexion 35# 2x10 BLE; 10# 2x10 RLE Knee extension 10# 2x10 B con/R ecc  Prone hang x 1 min Supine knee extension with over pressure x 10  08/31/22 Nustep L5x65min Standing knee flexion stretch 10x5" [employer] pews x 10 B Retro step x 20  Seated heel slides R with strap 10x5" Seated hamstring curl and knee extension GTB 10x QS in supine x 10 R  08/25/22 Nustep L5x72min LE only Seated hamstring stretch 2x30 sec with strap Gastroc heel drop stretch 3x30" Standing heel raise 2x15 B Seated R LAQ 2x10  Supine QS x 10 towel under heel  Supine SLR 2x10 RLE Supine heel slides 2x10 LE on peanut ball Supine leg press with manual resistance   08/23/22: Eval, instructed in the following to add to his home program": Quads sets with towel roll under knee Seated R  post capsule stretch R knee ( wife video'd position) Seated R knee flexion stretch  Standing B heel drops for calf stretch B  Game ready 10 min post session, for edema management, post ex pain      PATIENT EDUCATION:  Education details: HEP update  Person educated: Patient Education method: Explanation, Demonstration, Tactile cues, Verbal cues, and Handouts Education comprehension: verbalized understanding, returned demonstration, and verbal cues required  HOME EXERCISE PROGRAM: Access Code: [email or web address] URL: [email or web address] Date: 09/06/2022 Prepared by: Verta Ellen  Exercises - Seated Hamstring Stretch  - 2 x daily - 7 x weekly - 3 sets - 30 sec hold - Heel Raises with Counter Support  - 1 x daily - 7 x weekly - 3 sets - 10 reps - Standing Gastroc Stretch on Step  - 2 x daily - 7 x weekly - 3 sets - 30 sec hold - Seated Long Arc Quad  - 1 x daily - 7 x weekly - 3 sets - 10 reps - Church Pew  - 1 x daily - 7 x weekly - 3 sets - 10 reps - Retro Step  - 1 x daily - 7 x weekly - 3 sets - 10 reps   ASSESSMENT:  CLINICAL IMPRESSION: Continued with progression of exercises to tolerance, 4 weeks post op from R TKA.  Largest problem continues to be  is edema R ant knee. Hopefully will reabsorb with his healing process.  Excellent Rom and strength R LE .Will continue to benefit from skilled physical therapy to maximize his recovery from R TKA.   OBJECTIVE IMPAIRMENTS: decreased mobility, difficulty walking, decreased ROM, decreased strength, increased edema, impaired flexibility, and pain.   ACTIVITY LIMITATIONS: squatting, sleeping, stairs, and locomotion level  PARTICIPATION LIMITATIONS: driving and community activity  PERSONAL FACTORS: Fitness, Time since onset of injury/illness/exacerbation, and 1 comorbidity: afib  are also affecting patient's functional outcome.   REHAB POTENTIAL: Good  CLINICAL DECISION MAKING: Stable/uncomplicated  EVALUATION COMPLEXITY: Low   GOALS: Goals reviewed with patient? Yes  SHORT TERM GOALS: Target date: 2 weeks, 09/06/22 I HEP Baseline: initiated today Goal status: MET   LONG TERM GOALS: Target date:  10/04/22  ROM R knee -2 to 128 Baseline: -10 to 125 Goal status: IN PROGRESS  2.  Strength R LE 5/5 for efficient gait, transfers, stairs Baseline: 4/5 R quads, hams, SLR Goal status: IN PROGRESS  3.  LEFS improve to 10/80 Baseline: 20/80 Goal status: IN PROGRESS  4.  I unilateral stance on R LE greater than 30 sec for balance Baseline: not assessed initially Goal status: IN PROGRESS     PLAN:  PT FREQUENCY: 2x/week  PT DURATION: 8 weeks  PLANNED INTERVENTIONS: Therapeutic exercises, Therapeutic activity, Neuromuscular re-education, Balance training, Gait training, Patient/Family education, Self Care, and Joint mobilization  PLAN FOR NEXT SESSION: reassess flexibility, continue to advance strengthening as is appropriate   Davona Kinoshita L Haynes Giannotti, PT 09/13/2022, 5:26 PM

## 2022-09-15 ENCOUNTER — Ambulatory Visit: Payer: PRIVATE HEALTH INSURANCE

## 2022-09-15 ENCOUNTER — Other Ambulatory Visit: Payer: Self-pay

## 2022-09-15 DIAGNOSIS — Z96651 Presence of right artificial knee joint: Secondary | ICD-10-CM

## 2022-09-15 DIAGNOSIS — R262 Difficulty in walking, not elsewhere classified: Secondary | ICD-10-CM

## 2022-09-15 NOTE — Therapy (Signed)
OUTPATIENT PHYSICAL THERAPY TREATMENT   Patient Name: Francisco Stevens. Dershem MRN: [id number] DOB:[birth year], 77 y.o., male Today's Date: 09/15/2022  END OF SESSION:  PT End of Session - 09/15/22 1455     Visit Number 7    Date for PT Re-Evaluation 10/04/22    Progress Note Due on Visit 10    PT Start Time 1450    PT Stop Time 1530    PT Time Calculation (min) 40 min    Activity Tolerance Patient tolerated treatment well    Behavior During Therapy WFL for tasks assessed/performed                Past Medical History:  Diagnosis Date   Atrial fibrillation (HCC)    Gout    Hypertension    Prostate cancer [facility])    Past Surgical History:  Procedure Laterality Date   HERNIA REPAIR     PROSTATE SURGERY     ROTATOR CUFF REPAIR     Patient Active Problem List   Diagnosis Date Noted   Prostate cancer [facility]); pT2cNxMx, Gleason 4+3; s/p RALP 10/06 03/23/2021   Rising PSA following treatment for malignant neoplasm of prostate 03/23/2021   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 Referred to our clinic for post op rehab PAIN:  Are you having pain? Yes: NPRS scale: 2/10 Pain location: R knee  Pain description: low pain, have been cutting dosage of pain meds  Aggravating factors: the swelling is irritant Relieving factors: ice  PRECAUTIONS: None  WEIGHT BEARING RESTRICTIONS: No  FALLS:  Has patient fallen in last 6 months? No  LIVING ENVIRONMENT: Lives with: lives with their spouse Lives in:  House/apartment Stairs: indoors Has following equipment at home: Single point cane and Environmental consultant - 2 wheeled  OCCUPATION: owns a Physiological scientist   PLOF: Independent  PATIENT GOALS: return to playing golf ASAP  NEXT MD VISIT: 2 weeks   OBJECTIVE:   DIAGNOSTIC FINDINGS: na  PATIENT SURVEYS:  LEFS 20/80   COGNITION: Overall cognitive status: Within functional limits for tasks assessed     SENSATION: WFL  EDEMA:  Mod edema noted R knee, lower leg, ankle.  Bruising R medial and post thigh.  Has kinesiotape along knee and thigh , around aquacel bandage, patient reports wife's friend applied to reduce edema   POSTURE: No Significant postural limitations  PALPATION: Good patellar mobility noted R  LOWER EXTREMITY ROM:         Active ROM Right eval Left eval Right 08/31/22 Right 09/06/22 R  09/13/22  Hip flexion       Hip extension       Hip abduction       Hip adduction       Hip internal rotation       Hip external rotation       Knee flexion 125  116 121 127  Knee extension -10  6 4 3   Ankle dorsiflexion       Ankle plantarflexion       Ankle inversion  Ankle eversion        (Blank rows = not tested)  LOWER EXTREMITY MMT: R long arc quad , SLR -18  MMT Right eval Left eval  Hip flexion    Hip extension    Hip abduction    Hip adduction    Hip internal rotation    Hip external rotation    Knee flexion 4   Knee extension 4   Ankle dorsiflexion    Ankle plantarflexion 4   Ankle inversion    Ankle eversion     All other WNL    FUNCTIONAL TESTS:  10 meter walk test: 10.94 sec , 0.91 m/sec  GAIT: Distance walked: 120 ' in clinic Assistive device utilized: None Level of assistance: Complete Independence Comments: mild habitually flexed R knee    TODAY'S TREATMENT:                                                                                                                              DATE:   09/15/22: Scifit: 10 min, level 2 for  gentle stretching R LE Hamstring curls  B, 35#, 3 x 10 Leg press R LE, 15#, 3 x 10 Step ups forward R with opposite knee drivers L for terminal R knee extension strength mass practice Ball on wall for R knee extension mass practice Supine SLR, 10x Forward heel taps L LE with weight on R LE mass practice  Unilateral stance, up to 25 sec R, over 30 L   09/13/22: Nustep L 5, LE's only 6 min  NMES russian, 4 pads distal quads, co contract, utilized with short arc quads to engage distal quads and thereby milk, push fluid proximally. X 10 min, 10 sec on, 20 sec off R LE legg press, 15# 30 reps Lateral step downs L heel taps 4" cues to bend and fully straighten R knee Ball behind R knee for R knee extension isolation,  09/08/22: Bike L3 6 min NMES russian, 4 pads distal quads, co contract, utilized with short arc quads to engage distal quads and thereby milk, push fluid proximally. X 10 min, 10 sec on, 20 sec off Standing post leans against wall, heel rocks to cue distal quads mass practice Standing forward heel taps L from 1 1/2 " book to engage distal R quads mass practice Leg press 25# 2x10 BLE; 10# RLE 2x10 Knee flexion 35# 2x10 BLE; 10# 2x10 RLE Knee extension 10# 2x10 B con/R ecc   09/06/22 Bike L1x35min Step ups fwd 8' x 10 R Lateral step up x 10 R R gastroc stretch on step 8' 2x30 sec Leg press 25# 2x10 BLE; 10# RLE 2x10 Knee flexion 35# 2x10 BLE; 10# 2x10 RLE Knee extension 10# 2x10 B con/R ecc  Prone hang x 1 min Supine knee extension with over pressure x 10  08/31/22 Nustep L5x59min Standing knee flexion stretch 10x5" [employer] pews x 10 B Retro step x 20  Seated heel slides R  with strap 10x5" Seated hamstring curl and knee extension GTB 10x QS in supine x 10 R  08/25/22 Nustep L5x52min LE only Seated hamstring stretch 2x30 sec with strap Gastroc heel drop stretch 3x30" Standing heel raise 2x15 B Seated R LAQ 2x10  Supine QS x 10 towel under heel  Supine SLR 2x10  RLE Supine heel slides 2x10 LE on peanut ball Supine leg press with manual resistance   08/23/22: Eval, instructed in the following to add to his home program": Quads sets with towel roll under knee Seated R post capsule stretch R knee ( wife video'd position) Seated R knee flexion stretch  Standing B heel drops for calf stretch B  Game ready 10 min post session, for edema management, post ex pain     PATIENT EDUCATION:  Education details: HEP update  Person educated: Patient Education method: Explanation, Demonstration, Tactile cues, Verbal cues, and Handouts Education comprehension: verbalized understanding, returned demonstration, and verbal cues required  HOME EXERCISE PROGRAM: Access Code: [email or web address] URL: [email or web address] Date: 09/06/2022 Prepared by: Verta Ellen  Exercises - Seated Hamstring Stretch  - 2 x daily - 7 x weekly - 3 sets - 30 sec hold - Heel Raises with Counter Support  - 1 x daily - 7 x weekly - 3 sets - 10 reps - Standing Gastroc Stretch on Step  - 2 x daily - 7 x weekly - 3 sets - 30 sec hold - Seated Long Arc Quad  - 1 x daily - 7 x weekly - 3 sets - 10 reps - [employer] Pew  - 1 x daily - 7 x weekly - 3 sets - 10 reps - Retro Step  - 1 x daily - 7 x weekly - 3 sets - 10 reps   ASSESSMENT:  CLINICAL IMPRESSION: Continued with progression of exercises to tolerance,recovering very well at  4 weeks post op from R TKA.  Largest problem continues to be  is edema R ant knee. Also needs to address proprioception and balance on R LE . Excellent Rom and strength R LE .Will continue to benefit from skilled physical therapy to maximize his recovery from R TKA.   OBJECTIVE IMPAIRMENTS: decreased mobility, difficulty walking, decreased ROM, decreased strength, increased edema, impaired flexibility, and pain.   ACTIVITY LIMITATIONS: squatting, sleeping, stairs, and locomotion level  PARTICIPATION LIMITATIONS: driving and community  activity  PERSONAL FACTORS: Fitness, Time since onset of injury/illness/exacerbation, and 1 comorbidity: afib  are also affecting patient's functional outcome.   REHAB POTENTIAL: Good  CLINICAL DECISION MAKING: Stable/uncomplicated  EVALUATION COMPLEXITY: Low   GOALS: Goals reviewed with patient? Yes  SHORT TERM GOALS: Target date: 2 weeks, 09/06/22 I HEP Baseline: initiated today Goal status: MET   LONG TERM GOALS: Target date: 10/04/22  ROM R knee -2 to 128 Baseline: -10 to 125 Goal status: IN PROGRESS  2.  Strength R LE 5/5 for efficient gait, transfers, stairs Baseline: 4/5 R quads, hams, SLR Goal status: IN PROGRESS  3.  LEFS improve to 10/80 Baseline: 20/80 Goal status: IN PROGRESS  4.  I unilateral stance on R LE greater than 30 sec for balance Baseline: not assessed initially Goal status: IN PROGRESS     PLAN:  PT FREQUENCY: 2x/week  PT DURATION: 8 weeks  PLANNED INTERVENTIONS: Therapeutic exercises, Therapeutic activity, Neuromuscular re-education, Balance training, Gait training, Patient/Family education, Self Care, and Joint mobilization  PLAN FOR NEXT SESSION: reassess flexibility, continue to advance strengthening as is appropriate   Loucille Takach L Narda Fundora, PT  09/15/2022, 2:56 PM

## 2022-09-20 ENCOUNTER — Other Ambulatory Visit: Payer: Self-pay

## 2022-09-20 ENCOUNTER — Ambulatory Visit: Payer: PRIVATE HEALTH INSURANCE

## 2022-09-20 DIAGNOSIS — R262 Difficulty in walking, not elsewhere classified: Secondary | ICD-10-CM

## 2022-09-20 DIAGNOSIS — Z96651 Presence of right artificial knee joint: Secondary | ICD-10-CM

## 2022-09-20 NOTE — Therapy (Signed)
OUTPATIENT PHYSICAL THERAPY TREATMENT   Patient Name: Francisco Stevens. Miland MRN: [id number] DOB:1945/04/28, 77 y.o., male Today's Date: 09/20/2022  END OF SESSION:  PT End of Session - 09/20/22 1859     Visit Number 8    Date for PT Re-Evaluation 10/04/22    Progress Note Due on Visit 10    PT Start Time 1445    PT Stop Time 1530    PT Time Calculation (min) 45 min    Activity Tolerance Patient tolerated treatment well    Behavior During Therapy WFL for tasks assessed/performed                Past Medical History:  Diagnosis Date   Atrial fibrillation (HCC)    Gout    Hypertension    Prostate cancer [facility])    Past Surgical History:  Procedure Laterality Date   HERNIA REPAIR     PROSTATE SURGERY     ROTATOR CUFF REPAIR     Patient Active Problem List   Diagnosis Date Noted   Prostate cancer [facility]); pT2cNxMx, Gleason 4+3; s/p RALP 10/06 03/23/2021   Rising PSA following treatment for malignant neoplasm of prostate 03/23/2021   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 Referred to our clinic for post op rehab PAIN:  Are you having pain? Yes: NPRS scale: 2/10 Pain location: R knee  Pain description: low pain, have been cutting dosage of pain meds  Aggravating factors: the swelling is irritant Relieving factors: ice  PRECAUTIONS: None  WEIGHT BEARING RESTRICTIONS: No  FALLS:  Has patient fallen in last 6 months? No  LIVING ENVIRONMENT: Lives with: lives with their spouse Lives in: House/apartment Stairs: indoors Has  following equipment at home: Single point cane and Environmental consultant - 2 wheeled  OCCUPATION: owns a Physiological scientist   PLOF: Independent  PATIENT GOALS: return to playing golf ASAP  NEXT MD VISIT: 2 weeks   OBJECTIVE:   DIAGNOSTIC FINDINGS: na  PATIENT SURVEYS:  LEFS 20/80   COGNITION: Overall cognitive status: Within functional limits for tasks assessed     SENSATION: WFL  EDEMA:  Mod edema noted R knee, lower leg, ankle.  Bruising R medial and post thigh.  Has kinesiotape along knee and thigh , around aquacel bandage, patient reports wife's friend applied to reduce edema   POSTURE: No Significant postural limitations  PALPATION: Good patellar mobility noted R  LOWER EXTREMITY ROM:         Active ROM Right eval Left eval Right 08/31/22 Right 09/06/22 R  09/13/22  Hip flexion       Hip extension       Hip abduction       Hip adduction       Hip internal rotation       Hip external rotation       Knee flexion 125  116 121 127  Knee extension -10  6 4 3   Ankle dorsiflexion       Ankle plantarflexion       Ankle inversion       Ankle eversion        (  Blank rows = not tested)  LOWER EXTREMITY MMT: R long arc quad , SLR -18  MMT Right eval Left eval  Hip flexion    Hip extension    Hip abduction    Hip adduction    Hip internal rotation    Hip external rotation    Knee flexion 4   Knee extension 4   Ankle dorsiflexion    Ankle plantarflexion 4   Ankle inversion    Ankle eversion     All other WNL    FUNCTIONAL TESTS:  10 meter walk test: 10.94 sec , 0.91 m/sec  GAIT: Distance walked: 120 ' in clinic Assistive device utilized: None Level of assistance: Complete Independence Comments: mild habitually flexed R knee    TODAY'S TREATMENT:                                                                                                                              DATE:  09/20/22:  Scifit: 10 min, level 3 for gentle stretching R LE Hamstring curls   B, 35#, 3 x 10 Leg press R LE, 15#, 3 x 10 Lateral heel taps L to fatigue R quads TRX, lunges L LE forward to stretch R quads, patellar tendon, Supine SLR 15 reps  Vasopneumatic  10 min 34 degrees R LE to reduce edema 09/15/22: Scifit: 10 min, level 2 for gentle stretching R LE Hamstring curls  B, 35#, 3 x 10 Leg press R LE, 15#, 3 x 10 Step ups forward R with opposite knee drivers L for terminal R knee extension strength mass practice Ball on wall for R knee extension mass practice Supine SLR, 10x Forward heel taps L LE with weight on R LE mass practice  Unilateral stance, up to 25 sec R, over 30 L   09/13/22: Nustep L 5, LE's only 6 min  NMES russian, 4 pads distal quads, co contract, utilized with short arc quads to engage distal quads and thereby milk, push fluid proximally. X 10 min, 10 sec on, 20 sec off R LE legg press, 15# 30 reps Lateral step downs L heel taps 4" cues to bend and fully straighten R knee Ball behind R knee for R knee extension isolation,  09/08/22: Bike L3 6 min NMES russian, 4 pads distal quads, co contract, utilized with short arc quads to engage distal quads and thereby milk, push fluid proximally. X 10 min, 10 sec on, 20 sec off Standing post leans against wall, heel rocks to cue distal quads mass practice Standing forward heel taps L from 1 1/2 " book to engage distal R quads mass practice Leg press 25# 2x10 BLE; 10# RLE 2x10 Knee flexion 35# 2x10 BLE; 10# 2x10 RLE Knee extension 10# 2x10 B con/R ecc   09/06/22 Bike L1x60min Step ups fwd 8' x 10 R Lateral step up x 10 R R gastroc stretch on step 8' 2x30 sec Leg press 25# 2x10 BLE; 10# RLE  2x10 Knee flexion 35# 2x10 BLE; 10# 2x10 RLE Knee extension 10# 2x10 B con/R ecc  Prone hang x 1 min Supine knee extension with over pressure x 10  08/31/22 Nustep L5x7min Standing knee flexion stretch 10x5" [employer] pews x 10 B Retro step x 20  Seated heel slides R with strap 10x5" Seated hamstring curl and  knee extension GTB 10x QS in supine x 10 R  08/25/22 Nustep L5x75min LE only Seated hamstring stretch 2x30 sec with strap Gastroc heel drop stretch 3x30" Standing heel raise 2x15 B Seated R LAQ 2x10  Supine QS x 10 towel under heel  Supine SLR 2x10 RLE Supine heel slides 2x10 LE on peanut ball Supine leg press with manual resistance   08/23/22: Eval, instructed in the following to add to his home program": Quads sets with towel roll under knee Seated R post capsule stretch R knee ( wife video'd position) Seated R knee flexion stretch  Standing B heel drops for calf stretch B  Game ready 10 min post session, for edema management, post ex pain     PATIENT EDUCATION:  Education details: HEP update  Person educated: Patient Education method: Explanation, Demonstration, Tactile cues, Verbal cues, and Handouts Education comprehension: verbalized understanding, returned demonstration, and verbal cues required  HOME EXERCISE PROGRAM: Access Code: [email or web address] URL: [email or web address] Date: 09/06/2022 Prepared by: Verta Ellen  Exercises - Seated Hamstring Stretch  - 2 x daily - 7 x weekly - 3 sets - 30 sec hold - Heel Raises with Counter Support  - 1 x daily - 7 x weekly - 3 sets - 10 reps - Standing Gastroc Stretch on Step  - 2 x daily - 7 x weekly - 3 sets - 30 sec hold - Seated Long Arc Quad  - 1 x daily - 7 x weekly - 3 sets - 10 reps - Church Pew  - 1 x daily - 7 x weekly - 3 sets - 10 reps - Retro Step  - 1 x daily - 7 x weekly - 3 sets - 10 reps   ASSESSMENT:  CLINICAL IMPRESSION: Continued with progression of exercises to tolerance,recovering very well at  5 weeks post op from R TKA.  Largest problem continues to be  is edema R ant knee. .Will continue to benefit from skilled physical therapy to maximize his recovery from R TKA.to return to MD next week at which time we should DC PT   OBJECTIVE IMPAIRMENTS: decreased mobility, difficulty walking, decreased  ROM, decreased strength, increased edema, impaired flexibility, and pain.   ACTIVITY LIMITATIONS: squatting, sleeping, stairs, and locomotion level  PARTICIPATION LIMITATIONS: driving and community activity  PERSONAL FACTORS: Fitness, Time since onset of injury/illness/exacerbation, and 1 comorbidity: afib  are also affecting patient's functional outcome.   REHAB POTENTIAL: Good  CLINICAL DECISION MAKING: Stable/uncomplicated  EVALUATION COMPLEXITY: Low   GOALS: Goals reviewed with patient? Yes  SHORT TERM GOALS: Target date: 2 weeks, 09/06/22 I HEP Baseline: initiated today Goal status: MET   LONG TERM GOALS: Target date: 10/04/22  ROM R knee -2 to 128 Baseline: -10 to 125 Goal status: IN PROGRESS  2.  Strength R LE 5/5 for efficient gait, transfers, stairs Baseline: 4/5 R quads, hams, SLR Goal status: IN PROGRESS  3.  LEFS improve to 10/80 Baseline: 20/80 Goal status: IN PROGRESS  4.  I unilateral stance on R LE greater than 30 sec for balance Baseline: not assessed initially Goal status: IN PROGRESS     PLAN:  PT FREQUENCY: 2x/week  PT DURATION: 8 weeks  PLANNED INTERVENTIONS: Therapeutic exercises, Therapeutic activity, Neuromuscular re-education, Balance training, Gait training, Patient/Family education, Self Care, and Joint mobilization  PLAN FOR NEXT SESSION: reassess flexibility, continue to advance strengthening as is appropriate   Burle Kwan L Cristiana Yochim, PT 09/20/2022, 7:03 PM

## 2022-09-22 ENCOUNTER — Ambulatory Visit: Payer: PRIVATE HEALTH INSURANCE

## 2022-09-22 DIAGNOSIS — Z96651 Presence of right artificial knee joint: Secondary | ICD-10-CM

## 2022-09-22 DIAGNOSIS — R262 Difficulty in walking, not elsewhere classified: Secondary | ICD-10-CM

## 2022-09-22 NOTE — Therapy (Signed)
OUTPATIENT PHYSICAL THERAPY TREATMENT   Patient Name: Francisco Stevens MRN: [id number] DOB:January 23, 1946, 77 y.o., male Today's Date: 09/22/2022  END OF SESSION:  PT End of Session - 09/22/22 1454     Visit Number 9    Date for PT Re-Evaluation 10/04/22    Progress Note Due on Visit 10    PT Start Time 1449    PT Stop Time 1530    PT Time Calculation (min) 41 min    Activity Tolerance Patient tolerated treatment well    Behavior During Therapy WFL for tasks assessed/performed                Past Medical History:  Diagnosis Date   Atrial fibrillation (HCC)    Gout    Hypertension    Prostate cancer [facility])    Past Surgical History:  Procedure Laterality Date   HERNIA REPAIR     PROSTATE SURGERY     ROTATOR CUFF REPAIR     Patient Active Problem List   Diagnosis Date Noted   Prostate cancer [facility]); pT2cNxMx, Gleason 4+3; s/p RALP 10/06 03/23/2021   Rising PSA following treatment for malignant neoplasm of prostate 03/23/2021   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 PERTINENT HISTORY: Progressive decline ., DJD R knee, underwent TKA . Referred to our clinic for post op rehab PAIN:  Are you having pain? Yes: NPRS scale: 0.5/10 Pain location: R knee  Pain description: low pain, have been cutting dosage of pain meds  Aggravating factors: the swelling is irritant Relieving factors: ice  PRECAUTIONS: None  WEIGHT BEARING RESTRICTIONS: No  FALLS:  Has patient fallen in last 6 months? No  LIVING ENVIRONMENT: Lives with: lives with their spouse Lives in: House/apartment Stairs: indoors Has  following equipment at home: Single point cane and Environmental consultant - 2 wheeled  OCCUPATION: owns a Physiological scientist   PLOF: Independent  PATIENT GOALS: return to playing golf ASAP  NEXT MD VISIT: 09/29/22  OBJECTIVE:   DIAGNOSTIC FINDINGS: na  PATIENT SURVEYS:  LEFS 20/80   COGNITION: Overall cognitive status: Within functional limits for tasks assessed     SENSATION: WFL  EDEMA:  Mod edema noted R knee, lower leg, ankle.  Bruising R medial and post thigh.  Has kinesiotape along knee and thigh , around aquacel bandage, patient reports wife's friend applied to reduce edema   POSTURE: No Significant postural limitations  PALPATION: Good patellar mobility noted R  LOWER EXTREMITY ROM:         Active ROM Right eval Left eval Right 08/31/22 Right 09/06/22 R  09/13/22  Hip flexion       Hip extension       Hip abduction       Hip adduction       Hip internal rotation       Hip external rotation       Knee flexion 125  116 121 127  Knee extension -10  6 4 3   Ankle dorsiflexion       Ankle plantarflexion       Ankle inversion       Ankle eversion        (  Blank rows = not tested)  LOWER EXTREMITY MMT: R long arc quad , SLR -18  MMT Right eval Left eval Right 09/22/22  Hip flexion     Hip extension     Hip abduction     Hip adduction     Hip internal rotation     Hip external rotation     Knee flexion 4  5  Knee extension 4  5  Ankle dorsiflexion     Ankle plantarflexion 4  5  Ankle inversion     Ankle eversion      All other WNL    FUNCTIONAL TESTS:  10 meter walk test: 10.94 sec , 0.91 m/sec  GAIT: Distance walked: 120 ' in clinic Assistive device utilized: None Level of assistance: Complete Independence Comments: mild habitually flexed R knee    TODAY'S TREATMENT:                                                                                                                              DATE:  09/22/22 Bike L3x55min TRX lunges fwd x 10  TRX  lunge side x 10 Single leg dead lift x 10 R/L BATCA Knee flexion 20# 2x10 R/L  BATCA knee extension 10# 2x10 R/L   Passive knee extension in supine Superior patellar mobs  STM to hamstrings  09/20/22:  Scifit: 10 min, level 3 for gentle stretching R LE Hamstring curls  B, 35#, 3 x 10 Leg press R LE, 15#, 3 x 10 Lateral heel taps L to fatigue R quads TRX, lunges L LE forward to stretch R quads, patellar tendon, Supine SLR 15 reps  Vasopneumatic  10 min 34 degrees R LE to reduce edema 09/15/22: Scifit: 10 min, level 2 for gentle stretching R LE Hamstring curls  B, 35#, 3 x 10 Leg press R LE, 15#, 3 x 10 Step ups forward R with opposite knee drivers L for terminal R knee extension strength mass practice Ball on wall for R knee extension mass practice Supine SLR, 10x Forward heel taps L LE with weight on R LE mass practice  Unilateral stance, up to 25 sec R, over 30 L   09/13/22: Nustep L 5, LE's only 6 min  NMES russian, 4 pads distal quads, co contract, utilized with short arc quads to engage distal quads and thereby milk, push fluid proximally. X 10 min, 10 sec on, 20 sec off R LE legg press, 15# 30 reps Lateral step downs L heel taps 4" cues to bend and fully straighten R knee Ball behind R knee for R knee extension isolation,  09/08/22: Bike L3 6 min NMES russian, 4 pads distal quads, co contract, utilized with short arc quads to engage distal quads and thereby milk, push fluid proximally. X 10 min, 10 sec on, 20 sec off Standing post leans against wall, heel rocks to cue distal quads mass practice Standing forward heel taps L from 1 1/2 " book  to engage distal R quads mass practice Leg press 25# 2x10 BLE; 10# RLE 2x10 Knee flexion 35# 2x10 BLE; 10# 2x10 RLE Knee extension 10# 2x10 B con/R ecc   09/06/22 Bike L1x96min Step ups fwd 8' x 10 R Lateral step up x 10 R R gastroc stretch on step 8' 2x30 sec Leg press 25# 2x10 BLE; 10# RLE 2x10 Knee flexion 35# 2x10 BLE; 10#  2x10 RLE Knee extension 10# 2x10 B con/R ecc  Prone hang x 1 min Supine knee extension with over pressure x 10  08/31/22 Nustep L5x67min Standing knee flexion stretch 10x5" [employer] pews x 10 B Retro step x 20  Seated heel slides R with strap 10x5" Seated hamstring curl and knee extension GTB 10x QS in supine x 10 R  08/25/22 Nustep L5x32min LE only Seated hamstring stretch 2x30 sec with strap Gastroc heel drop stretch 3x30" Standing heel raise 2x15 B Seated R LAQ 2x10  Supine QS x 10 towel under heel  Supine SLR 2x10 RLE Supine heel slides 2x10 LE on peanut ball Supine leg press with manual resistance   08/23/22: Eval, instructed in the following to add to his home program": Quads sets with towel roll under knee Seated R post capsule stretch R knee ( wife video'd position) Seated R knee flexion stretch  Standing B heel drops for calf stretch B  Game ready 10 min post session, for edema management, post ex pain     PATIENT EDUCATION:  Education details: HEP update  Person educated: Patient Education method: Explanation, Demonstration, Tactile cues, Verbal cues, and Handouts Education comprehension: verbalized understanding, returned demonstration, and verbal cues required  HOME EXERCISE PROGRAM: Access Code: [email or web address] URL: [email or web address] Date: 09/06/2022 Prepared by: Verta Ellen  Exercises - Seated Hamstring Stretch  - 2 x daily - 7 x weekly - 3 sets - 30 sec hold - Heel Raises with Counter Support  - 1 x daily - 7 x weekly - 3 sets - 10 reps - Standing Gastroc Stretch on Step  - 2 x daily - 7 x weekly - 3 sets - 30 sec hold - Seated Long Arc Quad  - 1 x daily - 7 x weekly - 3 sets - 10 reps - [employer] P[employer]  - 1 x daily - 7 x weekly - 3 sets - 10 reps - Retro Step  - 1 x daily - 7 x weekly - 3 sets - 10 reps   ASSESSMENT:  CLINICAL IMPRESSION: Progressed exercises to improve knee strength and stability. Many cue required with single leg RDL to  keep knee straight. Cues also with lunges for proper form. He continues to have edematous R anterior knee, otherwise he is doing well with no other issues. He has met his strength goal (LTG #2). He will see MD on 09/29/22.   OBJECTIVE IMPAIRMENTS: decreased mobility, difficulty walking, decreased ROM, decreased strength, increased edema, impaired flexibility, and pain.   ACTIVITY LIMITATIONS: squatting, sleeping, stairs, and locomotion level  PARTICIPATION LIMITATIONS: driving and community activity  PERSONAL FACTORS: Fitness, Time since onset of injury/illness/exacerbation, and 1 comorbidity: afib  are also affecting patient's functional outcome.   REHAB POTENTIAL: Good  CLINICAL DECISION MAKING: Stable/uncomplicated  EVALUATION COMPLEXITY: Low   GOALS: Goals reviewed with patient? Yes  SHORT TERM GOALS: Target date: 2 weeks, 09/06/22 I HEP Baseline: initiated today Goal status: MET   LONG TERM GOALS: Target date: 10/04/22  ROM R knee -2 to 128 Baseline: -10 to 125 Goal status: IN PROGRESS  2.  Strength R LE 5/5 for efficient gait, transfers, stairs Baseline: 4/5 R quads, hams, SLR Goal status: MET- 09/22/22  3.  LEFS improve to 10/80 Baseline: 20/80 Goal status: IN PROGRESS  4.  I unilateral stance on R LE greater than 30 sec for balance Baseline: not assessed initially Goal status: IN PROGRESS      PLAN:  PT FREQUENCY: 2x/week  PT DURATION: 8 weeks  PLANNED INTERVENTIONS: Therapeutic exercises, Therapeutic activity, Neuromuscular re-education, Balance training, Gait training, Patient/Family education, Self Care, and Joint mobilization  PLAN FOR NEXT SESSION: reassess flexibility, continue to advance strengthening as is appropriate   Darleene Cleaver, PTA 09/22/2022, 4:33 PM

## 2022-09-27 ENCOUNTER — Ambulatory Visit: Payer: PRIVATE HEALTH INSURANCE | Attending: Orthopedic Surgery

## 2022-09-27 DIAGNOSIS — R262 Difficulty in walking, not elsewhere classified: Secondary | ICD-10-CM | POA: Diagnosis present

## 2022-09-27 DIAGNOSIS — Z96651 Presence of right artificial knee joint: Secondary | ICD-10-CM | POA: Diagnosis present

## 2022-09-27 NOTE — Therapy (Signed)
OUTPATIENT PHYSICAL THERAPY TREATMENT   Patient Name: Francisco Stevens. Donald MRN: [id number] DOB:21-Nov-1945, 77 y.o., male Today's Date: 09/27/2022  END OF SESSION:  PT End of Session - 09/27/22 1446     Visit Number 10    Date for PT Re-Evaluation 10/04/22    Progress Note Due on Visit 10    PT Start Time 1445    PT Stop Time 1529    PT Time Calculation (min) 44 min    Activity Tolerance Patient tolerated treatment well    Behavior During Therapy WFL for tasks assessed/performed                Past Medical History:  Diagnosis Date   Atrial fibrillation (HCC)    Gout    Hypertension    Prostate cancer [facility])    Past Surgical History:  Procedure Laterality Date   HERNIA REPAIR     PROSTATE SURGERY     ROTATOR CUFF REPAIR     Patient Active Problem List   Diagnosis Date Noted   Prostate cancer [facility]); pT2cNxMx, Gleason 4+3; s/p RALP 10/06 03/23/2021   Rising PSA following treatment for malignant neoplasm of prostate 03/23/2021   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 Referred to our clinic for post op rehab PAIN:  Are you having pain? Yes: NPRS scale: 0.5/10 Pain location: R knee  Pain description: low pain, have been cutting dosage of pain meds  Aggravating factors: the swelling is irritant Relieving factors: ice  PRECAUTIONS: None  WEIGHT BEARING RESTRICTIONS: No  FALLS:  Has patient fallen in last 6 months? No  LIVING ENVIRONMENT: Lives with: lives with their spouse Lives in: House/apartment Stairs:  indoors Has following equipment at home: Single point cane and Environmental consultant - 2 wheeled  OCCUPATION: owns a Physiological scientist   PLOF: Independent  PATIENT GOALS: return to playing golf ASAP  NEXT MD VISIT: 09/29/22  OBJECTIVE:   DIAGNOSTIC FINDINGS: na  PATIENT SURVEYS:  LEFS 20/80   COGNITION: Overall cognitive status: Within functional limits for tasks assessed     SENSATION: WFL  EDEMA:  Mod edema noted R knee, lower leg, ankle.  Bruising R medial and post thigh.  Has kinesiotape along knee and thigh , around aquacel bandage, patient reports wife's friend applied to reduce edema   POSTURE: No Significant postural limitations  PALPATION: Good patellar mobility noted R  LOWER EXTREMITY ROM:          Active ROM Right eval Left eval Right 08/31/22 Right 09/06/22 R  09/13/22 Right 09/27/22  Hip flexion        Hip extension        Hip abduction        Hip adduction        Hip internal rotation        Hip external rotation        Knee flexion 125  116 121 127 125  Knee extension -10  6 4 3 2   Ankle dorsiflexion        Ankle plantarflexion  Ankle inversion        Ankle eversion         (Blank rows = not tested)  LOWER EXTREMITY MMT: R long arc quad , SLR -18  MMT Right eval Left eval Right 09/22/22  Hip flexion     Hip extension     Hip abduction     Hip adduction     Hip internal rotation     Hip external rotation     Knee flexion 4  5  Knee extension 4  5  Ankle dorsiflexion     Ankle plantarflexion 4  5  Ankle inversion     Ankle eversion      All other WNL    FUNCTIONAL TESTS:  10 meter walk test: 10.94 sec , 0.91 m/sec  GAIT: Distance walked: 120 ' in clinic Assistive device utilized: None Level of assistance: Complete Independence Comments: mild habitually flexed R knee    TODAY'S TREATMENT:                                                                                                                              DATE:   09/27/22 Bike L3x20min Assessed stairs: WFL Fwd lunge x 10 Side lunge 2x10 Golf swing practice Knee extension 15# RLE x 10   09/22/22 Bike L3x2min TRX lunges fwd x 10  TRX lunge side x 10 Single leg dead lift x 10 R/L BATCA Knee flexion 20# 2x10 R/L  BATCA knee extension 10# 2x10 R/L   Passive knee extension in supine Superior patellar mobs  STM to hamstrings  09/20/22:  Scifit: 10 min, level 3 for gentle stretching R LE Hamstring curls  B, 35#, 3 x 10 Leg press R LE, 15#, 3 x 10 Lateral heel taps L to fatigue R quads TRX, lunges L LE forward to stretch R quads, patellar tendon, Supine SLR 15 reps  Vasopneumatic  10 min 34 degrees R LE to reduce edema 09/15/22: Scifit: 10 min, level 2 for gentle stretching R LE Hamstring curls  B, 35#, 3 x 10 Leg press R LE, 15#, 3 x 10 Step ups forward R with opposite knee drivers L for terminal R knee extension strength mass practice Ball on wall for R knee extension mass practice Supine SLR, 10x Forward heel taps L LE with weight on R LE mass practice  Unilateral stance, up to 25 sec R, over 30 L   09/13/22: Nustep L 5, LE's only 6 min  NMES russian, 4 pads distal quads, co contract, utilized with short arc quads to engage distal quads and thereby milk, push fluid proximally. X 10 min, 10 sec on, 20 sec off R LE legg press, 15# 30 reps Lateral step downs L heel taps 4" cues to bend and fully straighten R knee Ball behind R knee for R knee extension isolation,  09/08/22: Bike L3 6 min NMES russian, 4 pads distal quads, co contract, utilized with short arc  quads to engage distal quads and thereby milk, push fluid proximally. X 10 min, 10 sec on, 20 sec off Standing post leans against wall, heel rocks to cue distal quads mass practice Standing forward heel taps L from 1 1/2 " book to engage distal R quads mass practice Leg press 25# 2x10 BLE; 10# RLE 2x10 Knee flexion 35# 2x10 BLE; 10# 2x10 RLE Knee extension 10# 2x10 B con/R ecc    09/06/22 Bike L1x32min Step ups fwd 8' x 10 R Lateral step up x 10 R R gastroc stretch on step 8' 2x30 sec Leg press 25# 2x10 BLE; 10# RLE 2x10 Knee flexion 35# 2x10 BLE; 10# 2x10 RLE Knee extension 10# 2x10 B con/R ecc  Prone hang x 1 min Supine knee extension with over pressure x 10  08/31/22 Nustep L5x55min Standing knee flexion stretch 10x5" Church pews x 10 B Retro step x 20  Seated heel slides R with strap 10x5" Seated hamstring curl and knee extension GTB 10x QS in supine x 10 R  08/25/22 Nustep L5x70min LE only Seated hamstring stretch 2x30 sec with strap Gastroc heel drop stretch 3x30" Standing heel raise 2x15 B Seated R LAQ 2x10  Supine QS x 10 towel under heel  Supine SLR 2x10 RLE Supine heel slides 2x10 LE on peanut ball Supine leg press with manual resistance   08/23/22: Eval, instructed in the following to add to his home program": Quads sets with towel roll under knee Seated R post capsule stretch R knee ( wife video'd position) Seated R knee flexion stretch  Standing B heel drops for calf stretch B  Game ready 10 min post session, for edema management, post ex pain     PATIENT EDUCATION:  Education details: HEP update  Person educated: Patient Education method: Explanation, Demonstration, Tactile cues, Verbal cues, and Handouts Education comprehension: verbalized understanding, returned demonstration, and verbal cues required  HOME EXERCISE PROGRAM: Access Code: [email or web address] URL: [email or web address] Date: 09/06/2022 Prepared by: Verta Ellen  Exercises - Seated Hamstring Stretch  - 2 x daily - 7 x weekly - 3 sets - 30 sec hold - Heel Raises with Counter Support  - 1 x daily - 7 x weekly - 3 sets - 10 reps - Standing Gastroc Stretch on Step  - 2 x daily - 7 x weekly - 3 sets - 30 sec hold - Seated Long Arc Quad  - 1 x daily - 7 x weekly - 3 sets - 10 reps - Church Pew  - 1 x daily - 7 x weekly - 3 sets - 10 reps - Retro Step  - 1 x  daily - 7 x weekly - 3 sets - 10 reps   ASSESSMENT:  CLINICAL IMPRESSION: R knee ROM was measured, showing improvement in extension. He was able to hold R SLS for 20 seconds. LEFS has improved. We reviewed golf swings as well as functional strengthening of the R knee. Biggest issues is edema over R knee, which he was assured it will take 6 mo to 1 year to fully resolve. He is progressing well, will meet with PA on Thurs to discuss where to go from here.   OBJECTIVE IMPAIRMENTS: decreased mobility, difficulty walking, decreased ROM, decreased strength, increased edema, impaired flexibility, and pain.   ACTIVITY LIMITATIONS: squatting, sleeping, stairs, and locomotion level  PARTICIPATION LIMITATIONS: driving and community activity  PERSONAL FACTORS: Fitness, Time since onset of injury/illness/exacerbation, and 1 comorbidity: afib  are also affecting patient's functional outcome.   REHAB  POTENTIAL: Good  CLINICAL DECISION MAKING: Stable/uncomplicated  EVALUATION COMPLEXITY: Low   GOALS: Goals reviewed with patient? Yes  SHORT TERM GOALS: Target date: 2 weeks, 09/06/22 I HEP Baseline: initiated today Goal status: MET   LONG TERM GOALS: Target date: 10/04/22  ROM R knee -2 to 128 Baseline: -10 to 125 Goal status: IN PROGRESS- 09/27/22  2.  Strength R LE 5/5 for efficient gait, transfers, stairs Baseline: 4/5 R quads, hams, SLR Goal status: MET- 09/22/22  3.  LEFS improve to 10/80 Baseline: 20/80 Goal status: IN PROGRESS-  09/27/22 68 / 80 = 85.0 %  4.  I unilateral stance on R LE greater than 30 sec for balance Baseline: not assessed initially Goal status: IN PROGRESS- 09/27/22 RLE - 20 seconds      PLAN:  PT FREQUENCY: 2x/week  PT DURATION: 8 weeks  PLANNED INTERVENTIONS: Therapeutic exercises, Therapeutic activity, Neuromuscular re-education, Balance training, Gait training, Patient/Family education, Self Care, and Joint mobilization  PLAN FOR NEXT SESSION: reassess  flexibility, continue to advance strengthening as is appropriate   Darleene Cleaver, PTA 09/27/2022, 4:24 PM

## 2022-09-30 ENCOUNTER — Ambulatory Visit: Payer: PRIVATE HEALTH INSURANCE

## 2022-09-30 DIAGNOSIS — R262 Difficulty in walking, not elsewhere classified: Secondary | ICD-10-CM

## 2022-09-30 DIAGNOSIS — Z96651 Presence of right artificial knee joint: Secondary | ICD-10-CM

## 2022-09-30 NOTE — Therapy (Signed)
OUTPATIENT PHYSICAL THERAPY TREATMENT   Patient Name: Francisco Stevens. Dhanani MRN: [id number] DOB:1945/06/12, 77 y.o., male Today's Date: 09/30/2022  END OF SESSION:  PT End of Session - 09/30/22 1110     Visit Number 11    Date for PT Re-Evaluation 10/04/22    Progress Note Due on Visit 10    PT Start Time 1102    PT Stop Time 1140    PT Time Calculation (min) 38 min    Activity Tolerance Patient tolerated treatment well    Behavior During Therapy WFL for tasks assessed/performed                Past Medical History:  Diagnosis Date   Atrial fibrillation (HCC)    Gout    Hypertension    Prostate cancer [facility])    Past Surgical History:  Procedure Laterality Date   HERNIA REPAIR     PROSTATE SURGERY     ROTATOR CUFF REPAIR     Patient Active Problem List   Diagnosis Date Noted   Prostate cancer [facility]); pT2cNxMx, Gleason 4+3; s/p RALP 10/06 03/23/2021   Rising PSA following treatment for malignant neoplasm of prostate 03/23/2021   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   PERTINENT HISTORY: Progressive decline ., DJD R knee, underwent TKA . Referred to our clinic for post op rehab PAIN:  Are you having pain? No  PRECAUTIONS: None  WEIGHT BEARING RESTRICTIONS: No  FALLS:  Has patient fallen in last 6 months? No  LIVING ENVIRONMENT: Lives with: lives with their spouse Lives in: House/apartment Stairs: indoors Has following equipment at home: Single point cane and Environmental consultant - 2 wheeled  OCCUPATION: owns a Physiological scientist   PLOF: Independent  PATIENT GOALS: return to playing golf  ASAP  NEXT MD VISIT: 09/29/22  OBJECTIVE:   DIAGNOSTIC FINDINGS: na  PATIENT SURVEYS:  LEFS 20/80   COGNITION: Overall cognitive status: Within functional limits for tasks assessed     SENSATION: WFL  EDEMA:  Mod edema noted R knee, lower leg, ankle.  Bruising R medial and post thigh.  Has kinesiotape along knee and thigh , around aquacel bandage, patient reports wife's friend applied to reduce edema   POSTURE: No Significant postural limitations  PALPATION: Good patellar mobility noted R  LOWER EXTREMITY ROM:          Active ROM Right eval Left eval Right 08/31/22 Right 09/06/22 R  09/13/22 Right 09/27/22  Hip flexion        Hip extension        Hip abduction        Hip adduction        Hip internal rotation        Hip external rotation        Knee flexion 125  116 121 127 125  Knee extension -10  6 4 3 2   Ankle dorsiflexion        Ankle plantarflexion        Ankle inversion        Ankle eversion         (Blank rows = not tested)  LOWER EXTREMITY MMT:  R long arc quad , SLR -18  MMT Right eval Left eval Right 09/22/22  Hip flexion     Hip extension     Hip abduction     Hip adduction     Hip internal rotation     Hip external rotation     Knee flexion 4  5  Knee extension 4  5  Ankle dorsiflexion     Ankle plantarflexion 4  5  Ankle inversion     Ankle eversion      All other WNL    FUNCTIONAL TESTS:  10 meter walk test: 10.94 sec , 0.91 m/sec  GAIT: Distance walked: 120 ' in clinic Assistive device utilized: None Level of assistance: Complete Independence Comments: mild habitually flexed R knee    TODAY'S TREATMENT:                                                                                                                              DATE:  09/30/22 Supine figure 4 stretch 2x30 sec Single leg squat 2x10 Runner stretch x 30 sec Bike L5x20min Functional squat x 10   09/27/22 Bike L3x85min Assessed stairs: WFL Fwd lunge x 10 Side  lunge 2x10 Golf swing practice Knee extension 15# RLE x 10   09/22/22 Bike L3x71min TRX lunges fwd x 10  TRX lunge side x 10 Single leg dead lift x 10 R/L BATCA Knee flexion 20# 2x10 R/L  BATCA knee extension 10# 2x10 R/L   Passive knee extension in supine Superior patellar mobs  STM to hamstrings  09/20/22:  Scifit: 10 min, level 3 for gentle stretching R LE Hamstring curls  B, 35#, 3 x 10 Leg press R LE, 15#, 3 x 10 Lateral heel taps L to fatigue R quads TRX, lunges L LE forward to stretch R quads, patellar tendon, Supine SLR 15 reps  Vasopneumatic  10 min 34 degrees R LE to reduce edema 09/15/22: Scifit: 10 min, level 2 for gentle stretching R LE Hamstring curls  B, 35#, 3 x 10 Leg press R LE, 15#, 3 x 10 Step ups forward R with opposite knee drivers L for terminal R knee extension strength mass practice Ball on wall for R knee extension mass practice Supine SLR, 10x Forward heel taps L LE with weight on R LE mass practice  Unilateral stance, up to 25 sec R, over 30 L   09/13/22: Nustep L 5, LE's only 6 min  NMES russian, 4 pads distal quads, co contract, utilized with short arc quads to engage distal quads and thereby milk, push fluid proximally. X 10 min, 10 sec on, 20 sec off R LE legg press, 15# 30 reps Lateral step downs L heel taps 4" cues to bend and fully straighten R knee Ball behind R knee for R knee extension isolation,  09/08/22: Bike L3 6 min NMES russian, 4 pads distal quads, co contract, utilized with short arc quads to engage distal  quads and thereby milk, push fluid proximally. X 10 min, 10 sec on, 20 sec off Standing post leans against wall, heel rocks to cue distal quads mass practice Standing forward heel taps L from 1 1/2 " book to engage distal R quads mass practice Leg press 25# 2x10 BLE; 10# RLE 2x10 Knee flexion 35# 2x10 BLE; 10# 2x10 RLE Knee extension 10# 2x10 B con/R ecc   09/06/22 Bike L1x48min Step ups fwd 8' x 10 R Lateral step up x 10  R R gastroc stretch on step 8' 2x30 sec Leg press 25# 2x10 BLE; 10# RLE 2x10 Knee flexion 35# 2x10 BLE; 10# 2x10 RLE Knee extension 10# 2x10 B con/R ecc  Prone hang x 1 min Supine knee extension with over pressure x 10  08/31/22 Nustep L5x58min Standing knee flexion stretch 10x5" [employer] pews x 10 B Retro step x 20  Seated heel slides R with strap 10x5" Seated hamstring curl and knee extension GTB 10x QS in supine x 10 R  08/25/22 Nustep L5x66min LE only Seated hamstring stretch 2x30 sec with strap Gastroc heel drop stretch 3x30" Standing heel raise 2x15 B Seated R LAQ 2x10  Supine QS x 10 towel under heel  Supine SLR 2x10 RLE Supine heel slides 2x10 LE on peanut ball Supine leg press with manual resistance   08/23/22: Eval, instructed in the following to add to his home program": Quads sets with towel roll under knee Seated R post capsule stretch R knee ( wife video'd position) Seated R knee flexion stretch  Standing B heel drops for calf stretch B  Game ready 10 min post session, for edema management, post ex pain     PATIENT EDUCATION:  Education details: HEP update  Person educated: Patient Education method: Explanation, Demonstration, Tactile cues, Verbal cues, and Handouts Education comprehension: verbalized understanding, returned demonstration, and verbal cues required  HOME EXERCISE PROGRAM: Access Code: [email or web address] URL: [email or web address] Date: 09/06/2022 Prepared by: Verta Ellen  Exercises - Seated Hamstring Stretch  - 2 x daily - 7 x weekly - 3 sets - 30 sec hold - Heel Raises with Counter Support  - 1 x daily - 7 x weekly - 3 sets - 10 reps - Standing Gastroc Stretch on Step  - 2 x daily - 7 x weekly - 3 sets - 30 sec hold - Seated Long Arc Quad  - 1 x daily - 7 x weekly - 3 sets - 10 reps - [employer] Pew  - 1 x daily - 7 x weekly - 3 sets - 10 reps - Retro Step  - 1 x daily - 7 x weekly - 3 sets - 10 reps   ASSESSMENT:  CLINICAL  IMPRESSION: Pt noted a good report from MD yesterday. They did drain more fluid from his knee, still with a lot of swelling over the knee. Today was more of a review of HEP to prepare for discharge. He was complaining of having some pain with crossing his R leg so we added a figure 4 stretch in supine. He wants to come in on next week for final consult and to fully discharge.  OBJECTIVE IMPAIRMENTS: decreased mobility, difficulty walking, decreased ROM, decreased strength, increased edema, impaired flexibility, and pain.   ACTIVITY LIMITATIONS: squatting, sleeping, stairs, and locomotion level  PARTICIPATION LIMITATIONS: driving and community activity  PERSONAL FACTORS: Fitness, Time since onset of injury/illness/exacerbation, and 1 comorbidity: afib  are also affecting patient's functional outcome.   REHAB POTENTIAL: Good  CLINICAL DECISION MAKING:  Stable/uncomplicated  EVALUATION COMPLEXITY: Low   GOALS: Goals reviewed with patient? Yes  SHORT TERM GOALS: Target date: 2 weeks, 09/06/22 I HEP Baseline: initiated today Goal status: MET   LONG TERM GOALS: Target date: 10/04/22  ROM R knee -2 to 128 Baseline: -10 to 125 Goal status: IN PROGRESS- 09/27/22  2.  Strength R LE 5/5 for efficient gait, transfers, stairs Baseline: 4/5 R quads, hams, SLR Goal status: MET- 09/22/22  3.  LEFS improve to 10/80 Baseline: 20/80 Goal status: IN PROGRESS-  09/27/22 68 / 80 = 85.0 %  4.  I unilateral stance on R LE greater than 30 sec for balance Baseline: not assessed initially Goal status: IN PROGRESS- 09/27/22 RLE - 20 seconds      PLAN:  PT FREQUENCY: 2x/week  PT DURATION: 8 weeks  PLANNED INTERVENTIONS: Therapeutic exercises, Therapeutic activity, Neuromuscular re-education, Balance training, Gait training, Patient/Family education, Self Care, and Joint mobilization  PLAN FOR NEXT SESSION: reassess flexibility, continue to advance strengthening as is appropriate   Darleene Cleaver, PTA 09/30/2022, 11:55 AM

## 2022-10-04 ENCOUNTER — Ambulatory Visit: Payer: PRIVATE HEALTH INSURANCE

## 2022-10-04 ENCOUNTER — Other Ambulatory Visit: Payer: Self-pay

## 2022-10-04 DIAGNOSIS — R262 Difficulty in walking, not elsewhere classified: Secondary | ICD-10-CM | POA: Diagnosis not present

## 2022-10-04 DIAGNOSIS — Z96651 Presence of right artificial knee joint: Secondary | ICD-10-CM

## 2022-10-04 NOTE — Therapy (Signed)
OUTPATIENT PHYSICAL THERAPY DISCHARGE SUMMARY   Patient Name: Francisco Stevens. Kirschenmann MRN: [id number] DOB:[birth year], 77 y.o., male Today's Date: 10/04/2022  END OF SESSION:  PT End of Session - 10/04/22 1447     Visit Number 12    Date for PT Re-Evaluation 10/04/22    Progress Note Due on Visit 10    PT Start Time 1445    PT Stop Time 1530    PT Time Calculation (min) 45 min    Activity Tolerance Patient tolerated treatment well    Behavior During Therapy WFL for tasks assessed/performed                Past Medical History:  Diagnosis Date   Atrial fibrillation (HCC)    Gout    Hypertension    Prostate cancer [facility])    Past Surgical History:  Procedure Laterality Date   HERNIA REPAIR     PROSTATE SURGERY     ROTATOR CUFF REPAIR     Patient Active Problem List   Diagnosis Date Noted   Prostate cancer [facility]); pT2cNxMx, Gleason 4+3; s/p RALP 10/06 03/23/2021   Rising PSA following treatment for malignant neoplasm of prostate 03/23/2021   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 Referred to our clinic for post op rehab PAIN:  Are you having pain? No  PRECAUTIONS: None  WEIGHT BEARING RESTRICTIONS: No  FALLS:  Has patient fallen in last 6 months? No  LIVING ENVIRONMENT: Lives with: lives with their spouse Lives in: House/apartment Stairs: indoors Has following equipment at home: Single point cane and Environmental consultant - 2 wheeled  OCCUPATION: owns a Physiological scientist   PLOF: Independent  PATIENT GOALS: return to  playing golf ASAP  NEXT MD VISIT: 09/29/22  OBJECTIVE:   DIAGNOSTIC FINDINGS: na  PATIENT SURVEYS:  LEFS 20/80   COGNITION: Overall cognitive status: Within functional limits for tasks assessed     SENSATION: WFL  EDEMA:  Mod edema noted R knee, lower leg, ankle.  Bruising R medial and post thigh.  Has kinesiotape along knee and thigh , around aquacel bandage, patient reports wife's friend applied to reduce edema   POSTURE: No Significant postural limitations  PALPATION: Good patellar mobility noted R  LOWER EXTREMITY ROM:           Active ROM Right eval Left eval Right 08/31/22 Right 09/06/22 R  09/13/22 Right 09/27/22 R 10/04/22  Hip flexion         Hip extension         Hip abduction         Hip adduction         Hip internal rotation         Hip external rotation         Knee flexion 125  116 121 127 125 127  Knee extension -10  6 4 3 2 3   Ankle dorsiflexion         Ankle plantarflexion         Ankle inversion         Ankle eversion          (  Blank rows = not tested)  LOWER EXTREMITY MMT: R long arc quad , SLR -18  MMT Right eval Left eval Right 09/22/22 R 10/04/22  Hip flexion      Hip extension      Hip abduction      Hip adduction      Hip internal rotation      Hip external rotation      Knee flexion 4  5 5   Knee extension 4  5 5   Ankle dorsiflexion      Ankle plantarflexion 4  5   Ankle inversion      Ankle eversion       All other WNL    FUNCTIONAL TESTS:  10 meter walk test: 10.94 sec , 0.91 m/sec  GAIT: Distance walked: 120 ' in clinic Assistive device utilized: None Level of assistance: Complete Independence Comments: mild habitually flexed R knee    TODAY'S TREATMENT:                                                                                                                              DATE: 10/04/22: 127 -3  MMT 5/5 R LE Recumbent bike level 5, 8 min Lateral L heel taps from 6" 15 reps, without UE support B heel  drops, toe raises from step  Seated SLR Supine figure 4 stretch R knee 09/30/22 Supine figure 4 stretch 2x30 sec Single leg squat 2x10 Runner stretch x 30 sec Bike L5x56min Functional squat x 10   09/27/22 Bike L3x63min Assessed stairs: WFL Fwd lunge x 10 Side lunge 2x10 Golf swing practice Knee extension 15# RLE x 10   09/22/22 Bike L3x40min TRX lunges fwd x 10  TRX lunge side x 10 Single leg dead lift x 10 R/L BATCA Knee flexion 20# 2x10 R/L  BATCA knee extension 10# 2x10 R/L   Passive knee extension in supine Superior patellar mobs  STM to hamstrings  09/20/22:  Scifit: 10 min, level 3 for gentle stretching R LE Hamstring curls  B, 35#, 3 x 10 Leg press R LE, 15#, 3 x 10 Lateral heel taps L to fatigue R quads TRX, lunges L LE forward to stretch R quads, patellar tendon, Supine SLR 15 reps  Vasopneumatic  10 min 34 degrees R LE to reduce edema 09/15/22: Scifit: 10 min, level 2 for gentle stretching R LE Hamstring curls  B, 35#, 3 x 10 Leg press R LE, 15#, 3 x 10 Step ups forward R with opposite knee drivers L for terminal R knee extension strength mass practice Ball on wall for R knee extension mass practice Supine SLR, 10x Forward heel taps L LE with weight on R LE mass practice  Unilateral stance, up to 25 sec R, over 30 L   09/13/22: Nustep L 5, LE's only 6 min  NMES russian, 4 pads distal quads, co contract, utilized with short arc quads to engage distal quads and thereby milk, push fluid  proximally. X 10 min, 10 sec on, 20 sec off R LE legg press, 15# 30 reps Lateral step downs L heel taps 4" cues to bend and fully straighten R knee Ball behind R knee for R knee extension isolation,  09/08/22: Bike L3 6 min NMES russian, 4 pads distal quads, co contract, utilized with short arc quads to engage distal quads and thereby milk, push fluid proximally. X 10 min, 10 sec on, 20 sec off Standing post leans against wall, heel rocks to cue distal quads mass  practice Standing forward heel taps L from 1 1/2 " book to engage distal R quads mass practice Leg press 25# 2x10 BLE; 10# RLE 2x10 Knee flexion 35# 2x10 BLE; 10# 2x10 RLE Knee extension 10# 2x10 B con/R ecc   09/06/22 Bike L1x68min Step ups fwd 8' x 10 R Lateral step up x 10 R R gastroc stretch on step 8' 2x30 sec Leg press 25# 2x10 BLE; 10# RLE 2x10 Knee flexion 35# 2x10 BLE; 10# 2x10 RLE Knee extension 10# 2x10 B con/R ecc  Prone hang x 1 min Supine knee extension with over pressure x 10  08/31/22 Nustep L5x34min Standing knee flexion stretch 10x5" Church pews x 10 B Retro step x 20  Seated heel slides R with strap 10x5" Seated hamstring curl and knee extension GTB 10x QS in supine x 10 R  08/25/22 Nustep L5x9min LE only Seated hamstring stretch 2x30 sec with strap Gastroc heel drop stretch 3x30" Standing heel raise 2x15 B Seated R LAQ 2x10  Supine QS x 10 towel under heel  Supine SLR 2x10 RLE Supine heel slides 2x10 LE on peanut ball Supine leg press with manual resistance   08/23/22: Eval, instructed in the following to add to his home program": Quads sets with towel roll under knee Seated R post capsule stretch R knee ( wife video'd position) Seated R knee flexion stretch  Standing B heel drops for calf stretch B  Game ready 10 min post session, for edema management, post ex pain     PATIENT EDUCATION:  Education details: HEP update  Person educated: Patient Education method: Explanation, Demonstration, Tactile cues, Verbal cues, and Handouts Education comprehension: verbalized understanding, returned demonstration, and verbal cues required  HOME EXERCISE PROGRAM: Access Code: [email or web address] URL: [email or web address] Date: 09/06/2022 Prepared by: Verta Ellen  Exercises - Seated Hamstring Stretch  - 2 x daily - 7 x weekly - 3 sets - 30 sec hold - Heel Raises with Counter Support  - 1 x daily - 7 x weekly - 3 sets - 10 reps - Standing Gastroc  Stretch on Step  - 2 x daily - 7 x weekly - 3 sets - 30 sec hold - Seated Long Arc Quad  - 1 x daily - 7 x weekly - 3 sets - 10 reps - Church Pew  - 1 x daily - 7 x weekly - 3 sets - 10 reps - Retro Step  - 1 x daily - 7 x weekly - 3 sets - 10 reps   ASSESSMENT:  CLINICAL IMPRESSION: Pt still with a lot of swelling over the knee which is is biggest concern.  He is ready for DC today.  Has met goals.  Still bulbous edema R ant knee. But minimal pain, able to manage his golf swing without pain.. DC today  OBJECTIVE IMPAIRMENTS: decreased mobility, difficulty walking, decreased ROM, decreased strength, increased edema, impaired flexibility, and pain.   ACTIVITY LIMITATIONS: squatting, sleeping, stairs, and locomotion level  PARTICIPATION LIMITATIONS: driving and community activity  PERSONAL FACTORS: Fitness, Time since onset of injury/illness/exacerbation, and 1 comorbidity: afib  are also affecting patient's functional outcome.   REHAB POTENTIAL: Good  CLINICAL DECISION MAKING: Stable/uncomplicated  EVALUATION COMPLEXITY: Low   GOALS: Goals reviewed with patient? Yes  SHORT TERM GOALS: Target date: 2 weeks, 09/06/22 I HEP Baseline: initiated today Goal status: MET   LONG TERM GOALS: Target date: 10/04/22  ROM R knee -2 to 128 Baseline: -10 to 125 Goal status: MET- 09/27/22 10/04/22  2.  Strength R LE 5/5 for efficient gait, transfers, stairs Baseline: 4/5 R quads, hams, SLR Goal status: MET- 09/22/22  3.  LEFS improve to 10/80 Baseline: 20/80 Goal status: MET-  09/27/22 68 / 80 = 85.0 %   4.  I unilateral stance on R LE greater than 30 sec for balance Baseline: not assessed initially Goal status: MET- 09/27/22 RLE - 20 seconds 10/04/22/3 SEC     PLAN:  PT FREQUENCY: 2x/week  PT DURATION: 8 weeks  PLANNED INTERVENTIONS: Therapeutic exercises, Therapeutic activity, Neuromuscular re-education, Balance training, Gait training, Patient/Family education, Self Care, and  Joint mobilization  PLAN FOR NEXT SESSION: DC TODAY, goals met  Taylia Berber L Zeta Bucy, PT 10/04/2022, 5:12 PM

## 2023-02-23 IMAGING — PT NM PET TUM IMG SKULL BASE T - THIGH
1 of 7 series · 1 of 25 positions shown · non-contrast
Comparison: None.

CLINICAL DATA: Prostate carcinoma treated 15 years prior. Elevated
PSA equal 0.3.

EXAM:
NUCLEAR MEDICINE PET SKULL BASE TO THIGH
TECHNIQUE: 8.8 mCi F18 Piflufolastat (Pylarify) was injected intravenously.
Full-ring PET imaging was performed from the skull base to thigh
after the radiotracer. CT data was obtained and used for attenuation
correction and anatomic localization.

[Series 4: ct sk_thigh 5.0 bf37 · axial · 5.0mm · 0.98mm/px · 1 of 268 slices shown]
[im 214/268  brain]
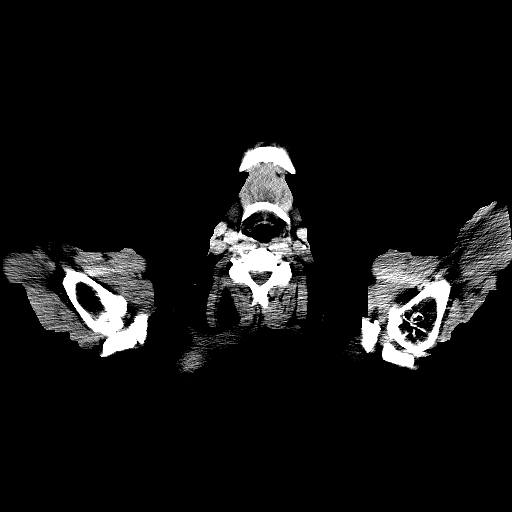

[1 of 25 positions shown; findings below may reference images not displayed]

FINDINGS: NECK

No radiotracer activity in neck lymph nodes. Focus of asymmetric
radiotracer activity anterior to the LEFT muscle mastication (image
44) is favored benign ectopic several tissue.

Incidental CT finding: None

CHEST

No radiotracer accumulation within mediastinal or hilar lymph nodes.
No suspicious pulmonary nodules on the CT scan.

Incidental CT finding: None

ABDOMEN/PELVIS

Prostate: No abnormal radiotracer activity in the prostatectomy bed.

Lymph nodes: No abnormal radiotracer accumulation within pelvic or
abdominal nodes.

Liver: No evidence of liver metastasis

Incidental CT finding: Benign low-density in high-density cyst of
the RIGHT kidney. Gallstones noted. Atherosclerotic calcification of
the aorta.

SKELETON

No focal activity to suggest skeletal metastasis. No blastic
lesions.
IMPRESSION: 1. No evidence local prostate cancer recurrence in the prostatectomy
bed.
2. No evidence metastatic adenopathy in the pelvis or periaortic
retroperitoneum.
3. No evidence of visceral metastasis or skeletal metastasis.

## 2023-03-13 ENCOUNTER — Telehealth: Payer: Self-pay | Admitting: Urology

## 2023-03-13 ENCOUNTER — Other Ambulatory Visit: Payer: Self-pay | Admitting: Urology

## 2023-03-13 DIAGNOSIS — N529 Male erectile dysfunction, unspecified: Secondary | ICD-10-CM

## 2023-03-13 NOTE — Telephone Encounter (Signed)
Would like another script for sildenafil

## 2023-03-15 ENCOUNTER — Other Ambulatory Visit: Payer: Self-pay | Admitting: Urology

## 2023-03-15 ENCOUNTER — Telehealth: Payer: Self-pay | Admitting: Urology

## 2023-03-15 DIAGNOSIS — N529 Male erectile dysfunction, unspecified: Secondary | ICD-10-CM

## 2023-03-15 MED ORDER — SILDENAFIL CITRATE 100 MG PO TABS: 100.0000 mg | ORAL_TABLET | Freq: Every day | ORAL | 11 refills | Status: DC | PRN

## 2023-03-15 NOTE — Telephone Encounter (Signed)
Patient called back and made an appt for 12/4   Pharmacy is [facility] [address] [place], [place] [address]

## 2023-03-29 ENCOUNTER — Encounter: Payer: Self-pay | Admitting: Urology

## 2023-03-29 ENCOUNTER — Ambulatory Visit (INDEPENDENT_AMBULATORY_CARE_PROVIDER_SITE_OTHER): Payer: PRIVATE HEALTH INSURANCE | Admitting: Urology

## 2023-03-29 VITALS — BP 179/69 | HR 69 | Ht 70.0 in | Wt 200.0 lb

## 2023-03-29 DIAGNOSIS — N529 Male erectile dysfunction, unspecified: Secondary | ICD-10-CM | POA: Diagnosis not present

## 2023-03-29 DIAGNOSIS — R9721 Rising PSA following treatment for malignant neoplasm of prostate: Secondary | ICD-10-CM

## 2023-03-29 DIAGNOSIS — Z8546 Personal history of malignant neoplasm of prostate: Secondary | ICD-10-CM

## 2023-03-29 DIAGNOSIS — N3942 Incontinence without sensory awareness: Secondary | ICD-10-CM | POA: Diagnosis not present

## 2023-03-29 LAB — URINALYSIS, ROUTINE W REFLEX MICROSCOPIC
Bilirubin, UA: NEGATIVE
Glucose, UA: NEGATIVE
Ketones, UA: NEGATIVE
Leukocytes,UA: NEGATIVE
Nitrite, UA: NEGATIVE
Protein,UA: NEGATIVE
RBC, UA: NEGATIVE
Specific Gravity, UA: 1.015 (ref 1.005–1.030)
Urobilinogen, Ur: 0.2 mg/dL (ref 0.2–1.0)
pH, UA: 5.5 (ref 5.0–7.5)

## 2023-03-29 LAB — BLADDER SCAN AMB NON-IMAGING

## 2023-03-29 MED ORDER — VARDENAFIL HCL 20 MG PO TABS: 20.0000 mg | ORAL_TABLET | Freq: Every day | ORAL | 11 refills | Status: AC | PRN

## 2023-03-29 MED ORDER — GEMTESA 75 MG PO TABS: 75.0000 mg | ORAL_TABLET | Freq: Every day | ORAL | Status: DC

## 2023-03-29 NOTE — Progress Notes (Signed)
Assessment: 1. History of prostate cancer;  pT2cNxMx, Gleason 4+3; s/p RALP 10/06   2. Organic impotence   3. Rising PSA following treatment for malignant neoplasm of prostate   4. Urinary incontinence without sensory awareness     Plan: Trial of Gemtesa 75 mg daily for urinary incontinence.  Samples given. I discussed alternative therapies for his erectile dysfunction including other oral agents, vacuum erection device, penile injections, urethral suppositories, and penile prosthesis.  He would like to try a alternative oral medication. Prescription for vardenafil 20 mg as needed provided. I discussed the potential significance of his rising PSA following prostatectomy for prostate cancer 18 years ago.  He has had a gradual slow increase.  I discussed that this could signify recurrent disease within the pelvis or possibly metastatic disease.  I discussed further evaluation with a PSMA PET scan. PSMA PET scan ordered for further evaluation of rising PSA. Will contact him with results and recommendations.  Chief Complaint: Chief Complaint  Patient presents with   Erectile Dysfunction    HPI: Francisco Stevens is a 77 y.o. male who presents for continued evaluation of history of prostate cancer and rising PSA. He is s/p RALP in October 2006. Path showed pT2cNxMx, Gleason 4+3=7 with negative margins. Pretreatment PSA was 5.58.  He has had a slowly rising PSA in recent years. Post op PSA: 12/12 0.01  1/15  0.01  3/17  0.02  5/18  0.05  7/19  0.12  8/19  0.09  11/19  0.11  7/20  0.13  1/21  0.13  7/21  0.15  6/22  0.20  11/22  0.3 9/23  0.2 10/24 0.5  His urinary symptoms remain unchanged with nocturia x2 and occasional incontinence.  No dysuria or gross hematuria.  He voids with a good stream. He does have erectile dysfunction.  He has been using sildenafil 100 mg as needed with satisfactory results.  He returns today for follow-up.  He had a recent PSA from October 2020 for  which was increased slightly to 0.5. He continues to have lower urinary tract symptoms with some recent increase including frequency, urgency, nocturia x 2, and urinary incontinence.  He has stress incontinence but also has incontinence without awareness.  He is not wearing pads at the present time.  No dysuria or gross hematuria.  He reports voiding with a good stream. IPSS = 1 today. He reports decreased results with sildenafil 100 mg.  He has previously tried tadalafil without benefit.   Portions of the above documentation were copied from a prior visit for review purposes only.  Allergies: Allergies  Allergen Reactions   Amlodipine    Tape     PMH: Past Medical History:  Diagnosis Date   Atrial fibrillation (HCC)    Gout    Hypertension    Prostate cancer (HCC)     PSH: Past Surgical History:  Procedure Laterality Date   HERNIA REPAIR     PROSTATE SURGERY     ROTATOR CUFF REPAIR      SH: Social History   Tobacco Use   Smoking status: Former    Types: Cigarettes   Smokeless tobacco: Never  Substance Use Topics   Alcohol use: Yes   Drug use: Never    ROS: Constitutional:  Negative for fever, chills, weight loss CV: Negative for chest pain, previous MI, hypertension Respiratory:  Negative for shortness of breath, wheezing, sleep apnea, frequent cough GI:  Negative for nausea, vomiting, bloody stool, GERD  PE: BP (!) 179/69   Pulse 69   Ht 5\' 10"  (1.778 m)   Wt 200 lb (90.7 kg)   BMI 28.70 kg/m  GENERAL APPEARANCE:  Well appearing, well developed, well nourished, NAD HEENT:  Atraumatic, normocephalic, oropharynx clear NECK:  Supple without lymphadenopathy or thyromegaly ABDOMEN:  Soft, non-tender, no masses EXTREMITIES:  Moves all extremities well, without clubbing, cyanosis, or edema NEUROLOGIC:  Alert and oriented x 3, normal gait, CN II-XII grossly intact MENTAL STATUS:  appropriate BACK:  Non-tender to palpation, No CVAT SKIN:  Warm, dry, and  intact   Results: U/A: negative  PVR = 22 ml

## 2023-04-13 ENCOUNTER — Telehealth: Payer: Self-pay | Admitting: Urology

## 2023-04-13 NOTE — Telephone Encounter (Signed)
Returned pt's call, advised pt Dr was out of office till 04/24/23, offered pt two more weeks of samples until he returns. Pt states he will pick them up this afternoon. Samples placed up front.

## 2023-04-13 NOTE — Telephone Encounter (Signed)
Was given Gemtesa Samples. Needs a script now for it.

## 2023-04-27 ENCOUNTER — Ambulatory Visit (HOSPITAL_COMMUNITY)
Admission: RE | Admit: 2023-04-27 | Discharge: 2023-04-27 | Disposition: A | Discharge status: Home / Self Care | Payer: 59 | Source: Ambulatory Visit | Attending: Urology | Admitting: Urology

## 2023-04-27 DIAGNOSIS — R9721 Rising PSA following treatment for malignant neoplasm of prostate: Secondary | ICD-10-CM | POA: Insufficient documentation

## 2023-04-27 DIAGNOSIS — Z8546 Personal history of malignant neoplasm of prostate: Secondary | ICD-10-CM | POA: Insufficient documentation

## 2023-04-27 MED ORDER — FLOTUFOLASTAT F 18 GALLIUM 296-5846 MBQ/ML IV SOLN
8.0000 | Freq: Once | INTRAVENOUS | Status: AC
  Administered 2023-04-27: 8.1 via INTRAVENOUS
  Filled 2023-04-27: qty 8

## 2023-05-02 ENCOUNTER — Telehealth: Payer: Self-pay | Admitting: Urology

## 2023-05-02 NOTE — Telephone Encounter (Signed)
 Needs a refill on Gemtesa

## 2023-05-03 ENCOUNTER — Other Ambulatory Visit: Payer: Self-pay | Admitting: Urology

## 2023-05-03 DIAGNOSIS — N3942 Incontinence without sensory awareness: Secondary | ICD-10-CM

## 2023-05-03 MED ORDER — GEMTESA 75 MG PO TABS: 75.0000 mg | ORAL_TABLET | Freq: Every day | ORAL | 11 refills | Status: DC

## 2023-05-08 ENCOUNTER — Encounter: Payer: Self-pay | Admitting: Urology

## 2023-11-14 ENCOUNTER — Telehealth: Payer: Self-pay | Admitting: Urology

## 2023-11-14 DIAGNOSIS — N3942 Incontinence without sensory awareness: Secondary | ICD-10-CM

## 2023-11-14 NOTE — Telephone Encounter (Signed)
 Patient started urinating blood Sunday night. It was dark when it started. As of today it is lighter. Patient wants to know what he should do.

## 2023-11-15 ENCOUNTER — Ambulatory Visit: Payer: Self-pay | Admitting: Urology

## 2023-11-15 ENCOUNTER — Other Ambulatory Visit

## 2023-11-15 DIAGNOSIS — R310 Gross hematuria: Secondary | ICD-10-CM

## 2023-11-15 DIAGNOSIS — R3129 Other microscopic hematuria: Secondary | ICD-10-CM

## 2023-11-15 LAB — URINALYSIS, ROUTINE W REFLEX MICROSCOPIC
Bilirubin, UA: NEGATIVE
Glucose, UA: NEGATIVE
Leukocytes,UA: NEGATIVE
Nitrite, UA: NEGATIVE
Protein,UA: NEGATIVE
Specific Gravity, UA: 1.01 (ref 1.005–1.030)
Urobilinogen, Ur: 0.2 mg/dL (ref 0.2–1.0)
pH, UA: 6.5 (ref 5.0–7.5)

## 2023-11-15 LAB — MICROSCOPIC EXAMINATION

## 2023-11-15 NOTE — Telephone Encounter (Signed)
 Pt added to lab schedule. Orders placed.

## 2023-11-15 NOTE — Addendum Note (Signed)
 Addended by: KOLEEN MITZIE BROCKS on: 11/15/2023 08:43 AM   Modules accepted: Orders

## 2023-11-24 ENCOUNTER — Ambulatory Visit (HOSPITAL_BASED_OUTPATIENT_CLINIC_OR_DEPARTMENT_OTHER)
Admission: RE | Admit: 2023-11-24 | Discharge: 2023-11-24 | Disposition: A | Discharge status: Home / Self Care | Source: Ambulatory Visit | Attending: Urology | Admitting: Urology

## 2023-11-24 DIAGNOSIS — R3129 Other microscopic hematuria: Secondary | ICD-10-CM | POA: Insufficient documentation

## 2023-11-24 DIAGNOSIS — R310 Gross hematuria: Secondary | ICD-10-CM | POA: Insufficient documentation

## 2023-11-24 MED ORDER — IOHEXOL 350 MG/ML SOLN
125.0000 mL | Freq: Once | INTRAVENOUS | Status: AC | PRN
Start: 2023-11-24 — End: 2023-11-24
  Administered 2023-11-24: 125 mL via INTRAVENOUS

## 2023-11-26 ENCOUNTER — Ambulatory Visit: Payer: Self-pay | Admitting: Urology

## 2023-12-08 ENCOUNTER — Ambulatory Visit (INDEPENDENT_AMBULATORY_CARE_PROVIDER_SITE_OTHER): Admitting: Urology

## 2023-12-08 ENCOUNTER — Encounter: Payer: Self-pay | Admitting: Urology

## 2023-12-08 VITALS — BP 157/91 | HR 93 | Ht 70.0 in | Wt 192.0 lb

## 2023-12-08 DIAGNOSIS — Z8546 Personal history of malignant neoplasm of prostate: Secondary | ICD-10-CM

## 2023-12-08 DIAGNOSIS — R310 Gross hematuria: Secondary | ICD-10-CM | POA: Diagnosis not present

## 2023-12-08 DIAGNOSIS — N2889 Other specified disorders of kidney and ureter: Secondary | ICD-10-CM

## 2023-12-08 DIAGNOSIS — R9721 Rising PSA following treatment for malignant neoplasm of prostate: Secondary | ICD-10-CM

## 2023-12-08 DIAGNOSIS — N529 Male erectile dysfunction, unspecified: Secondary | ICD-10-CM

## 2023-12-08 DIAGNOSIS — R3129 Other microscopic hematuria: Secondary | ICD-10-CM

## 2023-12-08 DIAGNOSIS — N3942 Incontinence without sensory awareness: Secondary | ICD-10-CM

## 2023-12-08 LAB — URINALYSIS, ROUTINE W REFLEX MICROSCOPIC
Bilirubin, UA: NEGATIVE
Glucose, UA: NEGATIVE
Ketones, UA: NEGATIVE
Leukocytes,UA: NEGATIVE
Nitrite, UA: NEGATIVE
Protein,UA: NEGATIVE
RBC, UA: NEGATIVE
Specific Gravity, UA: 1.015 (ref 1.005–1.030)
Urobilinogen, Ur: 0.2 mg/dL (ref 0.2–1.0)
pH, UA: 6.5 (ref 5.0–7.5)

## 2023-12-08 MED ORDER — CIPROFLOXACIN HCL 500 MG PO TABS
500.0000 mg | ORAL_TABLET | Freq: Once | ORAL | Status: AC
  Administered 2023-12-08: 500 mg via ORAL

## 2023-12-08 NOTE — Progress Notes (Signed)
 Assessment: 1. Gross hematuria   2. Microscopic hematuria   3. History of prostate cancer;  pT2cNxMx, Gleason 4+3; s/p RALP 10/06   4. Rising PSA following treatment for malignant neoplasm of prostate   5. Organic impotence   6. Urinary incontinence without sensory awareness   7. Renal mass, right     Plan: I personally reviewed the CT study from 11/25/2023 with results as noted below.  Results discussed with the patient today. Discussed further evaluation of the indeterminate right renal mass with a MRI. Cystoscopy findings discussed with the patient. Urine sent for cytology. Cipro  x 1 following cystoscopy. Return to office in 6 weeks PSA next visit  Chief Complaint: Chief Complaint  Patient presents with   Hematuria    HPI: Francisco Stevens is a 78 y.o. male who presents for continued evaluation of history of prostate cancer and rising PSA. He is s/p RALP in October 2006. Path showed pT2cNxMx, Gleason 4+3=7 with negative margins. Pretreatment PSA was 5.58.  He has had a slowly rising PSA in recent years. Post op PSA: 12/12 0.01  1/15  0.01  3/17  0.02  5/18  0.05  7/19  0.12  8/19  0.09  11/19  0.11  7/20  0.13  1/21  0.13  7/21  0.15  6/22  0.20  11/22  0.3 9/23  0.2 10/24 0.5  PSMA PET scan from 1/25 showed no evidence of prostate cancer recurrence or metastatic disease.   At his visit in December 2024, he continued to have lower urinary tract symptoms with some recent increase including frequency, urgency, nocturia x 2, and urinary incontinence.  He continued with stress incontinence but also reported incontinence without awareness.  He was not wearing pads.  No dysuria or gross hematuria.  He reported voiding with a good stream. IPSS = 1. He reported decreased results with sildenafil  100 mg.  He had previously tried tadalafil without benefit. He was given a trial of Gemtesa  75 mg daily for his incontinence. He was also given a prescription for vardenafil  20  mg as needed.  He recently noted gross hematuria. Urinalysis from 11/15/2023 showed 3-10 RBCs. CT hematuria protocol from 11/25/2023 showed a 12 mm right lower pole renal lesion with possible internal complexity, 2.7 cm right renal cyst, hyperdensities in the collecting system favored to represent a Test bolus injection of contrast, trabeculation of the urinary bladder. Presents today for further evaluation with cystoscopy.  No further episodes of gross hematuria.  He continues to have symptoms of incontinence.  He is taking Gemtesa  every other day but does not feel like this is significantly improving his symptoms.  Portions of the above documentation were copied from a prior visit for review purposes only.  Allergies: Allergies  Allergen Reactions   Amlodipine    Tape     PMH: Past Medical History:  Diagnosis Date   Atrial fibrillation (HCC)    Gout    Hypertension    Prostate cancer (HCC)     PSH: Past Surgical History:  Procedure Laterality Date   HERNIA REPAIR     PROSTATE SURGERY     ROTATOR CUFF REPAIR      SH: Social History   Tobacco Use   Smoking status: Former    Types: Cigarettes   Smokeless tobacco: Never  Substance Use Topics   Alcohol use: Yes   Drug use: Never    ROS: Constitutional:  Negative for fever, chills, weight loss CV: Negative for chest pain,  previous MI, hypertension Respiratory:  Negative for shortness of breath, wheezing, sleep apnea, frequent cough GI:  Negative for nausea, vomiting, bloody stool, GERD  PE: BP (!) 157/91   Pulse 93   Ht 5' 10 (1.778 m)   Wt 192 lb (87.1 kg)   BMI 27.55 kg/m  GENERAL APPEARANCE:  Well appearing, well developed, well nourished, NAD HEENT:  Atraumatic, normocephalic, oropharynx clear NECK:  Supple without lymphadenopathy or thyromegaly ABDOMEN:  Soft, non-tender, no masses EXTREMITIES:  Moves all extremities well, without clubbing, cyanosis, or edema NEUROLOGIC:  Alert and oriented x 3, normal  gait, CN II-XII grossly intact MENTAL STATUS:  appropriate BACK:  Non-tender to palpation, No CVAT SKIN:  Warm, dry, and intact   Results: U/A: negative  Procedure:  Flexible Cystourethroscopy  Pre-operative Diagnosis: Gross hematuria  Post-operative Diagnosis: Gross hematuria  Anesthesia:  local with lidocaine jelly  Surgical Narrative:  After appropriate informed consent was obtained, the patient was prepped and draped in the usual sterile fashion in the supine position.  The patient was correctly identified and the proper procedure delineated prior to proceeding.  Sterile lidocaine gel was instilled in the urethra. The flexible cystoscope was introduced without difficulty.  Findings:  Anterior urethra: Normal  Posterior urethra: Post radical prostatectomy  Bladder: no mucosal lesions seen  Ureteral orifices: normal  Additional findings:  Saline bladder wash for cytology was performed.    The cystoscope was then removed.  The patient tolerated the procedure well.

## 2023-12-13 ENCOUNTER — Ambulatory Visit: Payer: Self-pay | Admitting: Urology

## 2023-12-13 ENCOUNTER — Encounter: Payer: Self-pay | Admitting: Urology

## 2023-12-13 LAB — UROV MD:CYTOLOGY W/REFLEX FISH ATYPICAL CYTOLOGY

## 2024-01-16 ENCOUNTER — Encounter: Payer: Self-pay | Admitting: Urology

## 2024-01-16 ENCOUNTER — Ambulatory Visit (INDEPENDENT_AMBULATORY_CARE_PROVIDER_SITE_OTHER): Admitting: Urology

## 2024-01-16 VITALS — BP 162/89 | HR 92 | Ht 70.0 in | Wt 190.0 lb

## 2024-01-16 DIAGNOSIS — N529 Male erectile dysfunction, unspecified: Secondary | ICD-10-CM

## 2024-01-16 DIAGNOSIS — Z8546 Personal history of malignant neoplasm of prostate: Secondary | ICD-10-CM

## 2024-01-16 DIAGNOSIS — N3942 Incontinence without sensory awareness: Secondary | ICD-10-CM

## 2024-01-16 DIAGNOSIS — N2889 Other specified disorders of kidney and ureter: Secondary | ICD-10-CM

## 2024-01-16 DIAGNOSIS — R3129 Other microscopic hematuria: Secondary | ICD-10-CM

## 2024-01-16 DIAGNOSIS — R310 Gross hematuria: Secondary | ICD-10-CM | POA: Diagnosis not present

## 2024-01-16 DIAGNOSIS — R9721 Rising PSA following treatment for malignant neoplasm of prostate: Secondary | ICD-10-CM | POA: Diagnosis not present

## 2024-01-16 LAB — URINALYSIS, ROUTINE W REFLEX MICROSCOPIC
Bilirubin, UA: NEGATIVE
Glucose, UA: NEGATIVE
Ketones, UA: NEGATIVE
Leukocytes,UA: NEGATIVE
Nitrite, UA: NEGATIVE
Protein,UA: NEGATIVE
RBC, UA: NEGATIVE
Specific Gravity, UA: 1.015 (ref 1.005–1.030)
Urobilinogen, Ur: 0.2 mg/dL (ref 0.2–1.0)
pH, UA: 7 (ref 5.0–7.5)

## 2024-01-16 NOTE — Progress Notes (Signed)
 Assessment: 1. Gross hematuria   2. Microscopic hematuria   3. History of prostate cancer;  pT2cNxMx, Gleason 4+3; s/p RALP 10/06   4. Rising PSA following treatment for malignant neoplasm of prostate   5. Organic impotence   6. Urinary incontinence without sensory awareness   7. Renal mass, right     Plan: Discussed further evaluation of the indeterminate right renal mass with a MRI.  He would like to hold on any additional imaging at this time. PSA, testosterone  today Return to office in 6 months  Chief Complaint: Chief Complaint  Patient presents with   Hematuria    HPI: Francisco Homann. Stevens is a 78 y.o. male who presents for continued evaluation of history of prostate cancer and rising PSA. He is s/p RALP in October 2006. Path showed pT2cNxMx, Gleason 4+3=7 with negative margins. Pretreatment PSA was 5.58.  He has had a slowly rising PSA in recent years. Post op PSA: 12/12 0.01  1/15  0.01  3/17  0.02  5/18  0.05  7/19  0.12  8/19  0.09  11/19  0.11  7/20  0.13  1/21  0.13  7/21  0.15  6/22  0.20  11/22  0.3 9/23  0.2 10/24 0.5  PSMA PET scan from 1/25 showed no evidence of prostate cancer recurrence or metastatic disease.   At his visit in December 2024, he continued to have lower urinary tract symptoms with some recent increase including frequency, urgency, nocturia x 2, and urinary incontinence.  He continued with stress incontinence but also reported incontinence without awareness.  He was not wearing pads.  No dysuria or gross hematuria.  He reported voiding with a good stream. IPSS = 1. He reported decreased results with sildenafil  100 mg.  He had previously tried tadalafil without benefit. He was given a trial of Gemtesa  75 mg daily for his incontinence. He was also given a prescription for vardenafil  20 mg as needed.  He had an episode of gross hematuria in July 2025. Urinalysis from 11/15/2023 showed 3-10 RBCs. CT hematuria protocol from 11/25/2023 showed  a 12 mm right lower pole renal lesion with possible internal complexity, 2.7 cm right renal cyst, hyperdensities in the collecting system favored to represent a test bolus injection of contrast, trabeculation of the urinary bladder. He presented for cystoscopy on 12/08/2023.  No further episodes of gross hematuria.  He continued to have symptoms of incontinence.  He was taking Gemtesa  every other day but did not not feel like this was significantly improving his symptoms. Cystoscopy showed no bladder abnormalities.  Urine cytology was negative for malignancy.  He returns today for follow-up.  No further episodes of gross hematuria.  He continues with stable lower urinary tract symptoms including frequency, urgency, nocturia x 2, and occasional incontinence. IPSS = 8/2. He has not seen any results with vardenafil .  He is currently using sublingual testosterone  prescribed by [facility].  No recent lab results.  He does report a decreased libido.  Portions of the above documentation were copied from a prior visit for review purposes only.  Allergies: Allergies  Allergen Reactions   Amlodipine    Tape     PMH: Past Medical History:  Diagnosis Date   Atrial fibrillation (HCC)    Gout    Hypertension    Prostate cancer (HCC)     PSH: Past Surgical History:  Procedure Laterality Date   HERNIA REPAIR     PROSTATE SURGERY  ROTATOR CUFF REPAIR      SH: Social History   Tobacco Use   Smoking status: Former    Types: Cigarettes   Smokeless tobacco: Never  Substance Use Topics   Alcohol use: Yes   Drug use: Never    ROS: Constitutional:  Negative for fever, chills, weight loss CV: Negative for chest pain, previous MI, hypertension Respiratory:  Negative for shortness of breath, wheezing, sleep apnea, frequent cough GI:  Negative for nausea, vomiting, bloody stool, GERD  PE: BP (!) 162/89   Pulse 92   Ht 5' 10 (1.778 m)   Wt 190 lb (86.2 kg)   BMI  27.26 kg/m  GENERAL APPEARANCE:  Well appearing, well developed, well nourished, NAD HEENT:  Atraumatic, normocephalic, oropharynx clear NECK:  Supple without lymphadenopathy or thyromegaly ABDOMEN:  Soft, non-tender, no masses EXTREMITIES:  Moves all extremities well, without clubbing, cyanosis, or edema NEUROLOGIC:  Alert and oriented x 3, normal gait, CN II-XII grossly intact MENTAL STATUS:  appropriate BACK:  Non-tender to palpation, No CVAT SKIN:  Warm, dry, and intact   Results: U/A: Negative

## 2024-01-17 ENCOUNTER — Ambulatory Visit: Payer: Self-pay | Admitting: Urology

## 2024-01-17 LAB — TESTOSTERONE: Testosterone: 717 ng/dL (ref 264–916)

## 2024-01-17 LAB — PSA: Prostate Specific Ag, Serum: 0.5 ng/mL (ref 0.0–4.0)

## 2024-05-21 ENCOUNTER — Other Ambulatory Visit: Payer: Self-pay | Admitting: Urology

## 2024-05-21 DIAGNOSIS — N3942 Incontinence without sensory awareness: Secondary | ICD-10-CM

## 2024-07-16 ENCOUNTER — Ambulatory Visit: Admitting: Urology
